# Patient Record
Sex: Female | Born: 1985 | Race: White | Hispanic: No | Marital: Married | Smoking: Never smoker
Health system: Southern US, Community
[De-identification: ages and names within clinical notes are randomized; demographics above are authoritative.]

## PROBLEM LIST (undated history)

## (undated) ENCOUNTER — Inpatient Hospital Stay (HOSPITAL_COMMUNITY): Payer: Self-pay

## (undated) ENCOUNTER — Inpatient Hospital Stay (HOSPITAL_COMMUNITY): Payer: 59

## (undated) DIAGNOSIS — G43909 Migraine, unspecified, not intractable, without status migrainosus: Secondary | ICD-10-CM

## (undated) DIAGNOSIS — Z87448 Personal history of other diseases of urinary system: Secondary | ICD-10-CM

## (undated) DIAGNOSIS — R87629 Unspecified abnormal cytological findings in specimens from vagina: Secondary | ICD-10-CM

## (undated) DIAGNOSIS — Q899 Congenital malformation, unspecified: Secondary | ICD-10-CM

## (undated) HISTORY — PX: KIDNEY SURGERY: SHX687

## (undated) HISTORY — DX: Personal history of other diseases of urinary system: Z87.448

## (undated) HISTORY — DX: Unspecified abnormal cytological findings in specimens from vagina: R87.629

## (undated) HISTORY — DX: Congenital malformation, unspecified: Q89.9

---
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
## 2007-12-08 ENCOUNTER — Ambulatory Visit (HOSPITAL_COMMUNITY): Admission: RE | Admit: 2007-12-08 | Discharge: 2007-12-08 | Payer: Self-pay | Admitting: Urology

## 2008-02-27 IMAGING — CR DG ABD PORTABLE 1V
1 series · 1 of 1 positions shown · non-contrast
Comparison: none

CLINICAL DATA: Post-op. 
 ABDOMEN ? 1 VIEW:

[view not recorded]
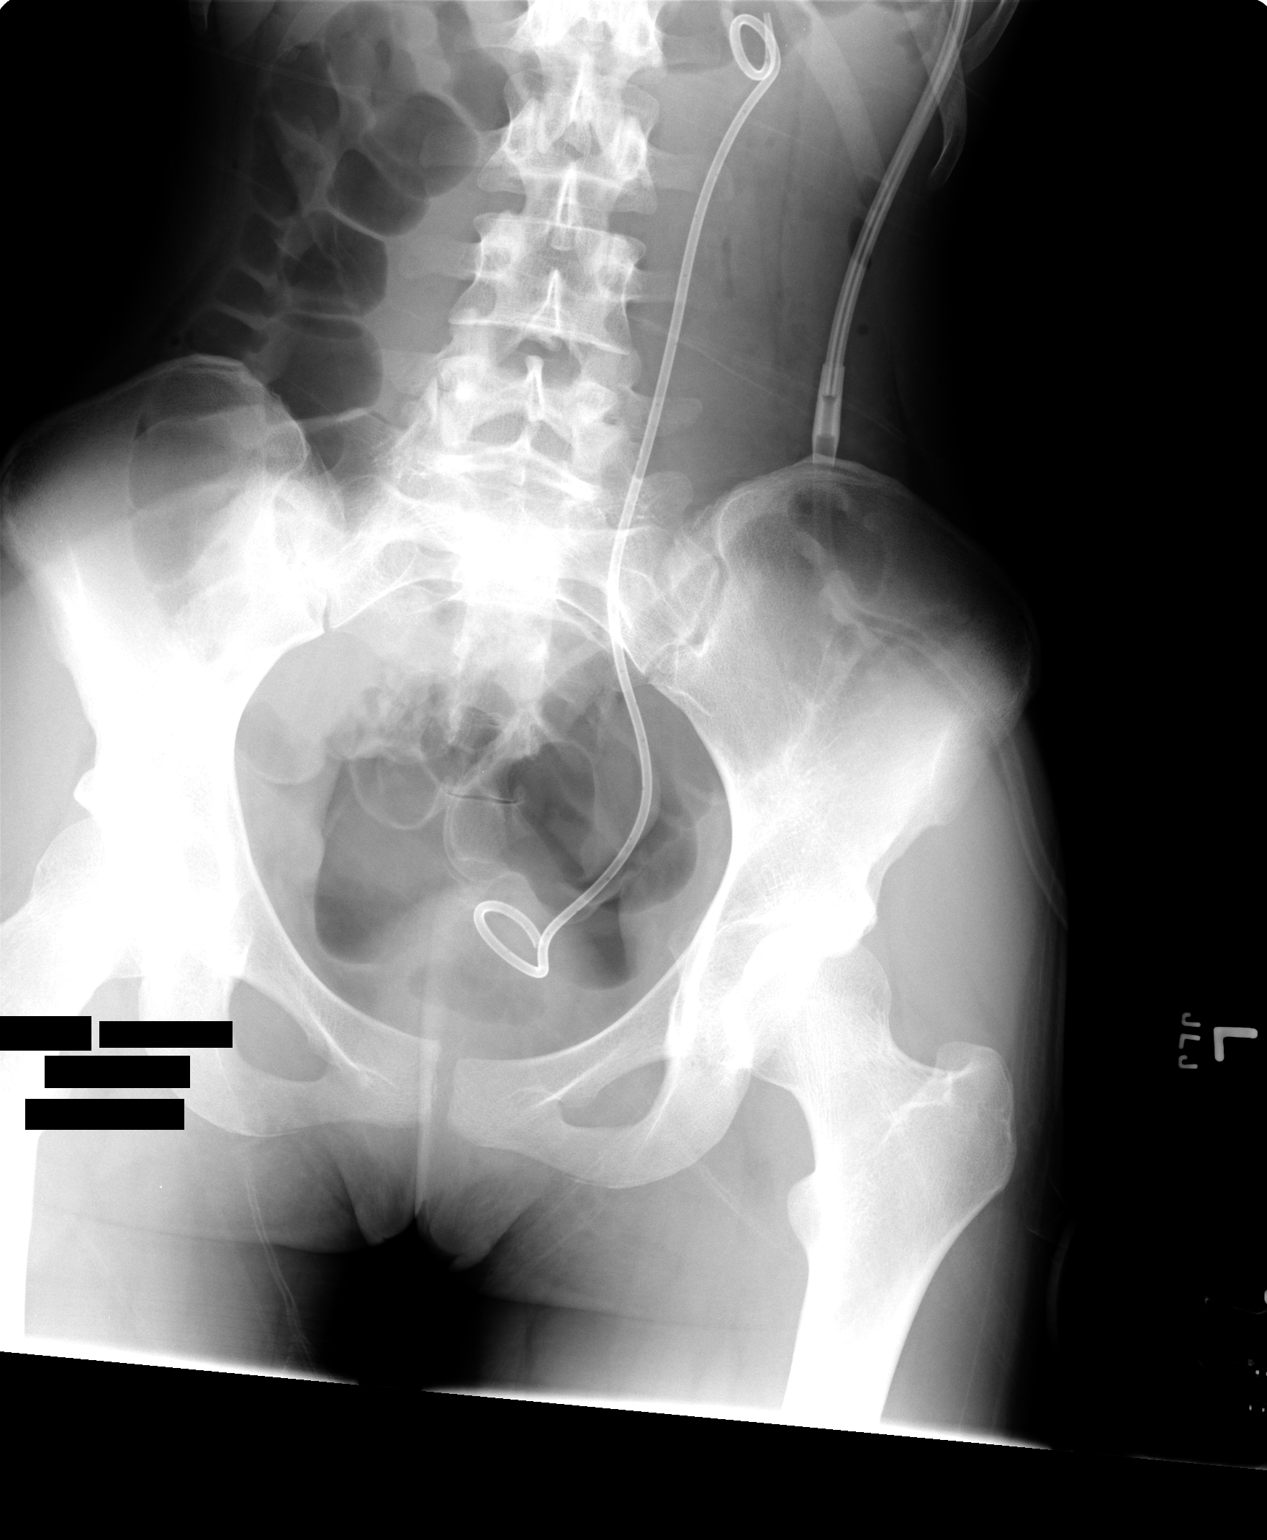

[1 of 1 positions shown; findings below may reference images not displayed]

FINDINGS: Left double J ureteral stent has been placed.  The stent is formed proximally in the region of the renal pelvis and distally in the bladder.  Normal bowel gas pattern.  Osseous structures unremarkable.
IMPRESSION: Left double J stent appears to be in satisfactory position.

## 2008-12-08 ENCOUNTER — Ambulatory Visit (HOSPITAL_COMMUNITY): Admission: RE | Admit: 2008-12-08 | Discharge: 2008-12-08 | Payer: Self-pay | Admitting: Urology

## 2009-01-03 ENCOUNTER — Encounter: Payer: Self-pay | Admitting: Internal Medicine

## 2009-01-17 ENCOUNTER — Encounter (INDEPENDENT_AMBULATORY_CARE_PROVIDER_SITE_OTHER): Payer: Self-pay | Admitting: *Deleted

## 2009-03-02 ENCOUNTER — Ambulatory Visit: Payer: Self-pay | Admitting: Internal Medicine

## 2009-03-02 DIAGNOSIS — R002 Palpitations: Secondary | ICD-10-CM

## 2009-03-05 ENCOUNTER — Ambulatory Visit (HOSPITAL_COMMUNITY): Admission: RE | Admit: 2009-03-05 | Discharge: 2009-03-05 | Payer: Self-pay | Admitting: Cardiology

## 2009-03-05 ENCOUNTER — Ambulatory Visit: Payer: Self-pay | Admitting: Cardiology

## 2009-03-05 ENCOUNTER — Ambulatory Visit: Payer: Self-pay

## 2009-03-05 ENCOUNTER — Encounter: Payer: Self-pay | Admitting: Internal Medicine

## 2009-12-05 ENCOUNTER — Ambulatory Visit (HOSPITAL_COMMUNITY): Admission: RE | Admit: 2009-12-05 | Discharge: 2009-12-05 | Payer: Self-pay | Admitting: Urology

## 2010-05-19 ENCOUNTER — Encounter: Payer: Self-pay | Admitting: Urology

## 2010-05-20 ENCOUNTER — Encounter: Payer: Self-pay | Admitting: Urology

## 2010-09-13 NOTE — Discharge Summary (Signed)
NAMEMORAIMA, BURD NO.:  [id number]   MEDICAL RECORD NO.:  [id number]          PATIENT TYPE:  INP   LOCATION:  1440                         FACILITY:  [facility]   PHYSICIAN:  Heloise Purpura, MD      DATE OF BIRTH:  02-23-1986   DATE OF ADMISSION:  07/16/2006  DATE OF DISCHARGE:  07/19/2006                               DISCHARGE SUMMARY   HISTORY OF PRESENT ILLNESS:  For full details please see admission  history and physical.  Briefly, Nichole is an 25 year old female with a  left ureteropelvic junction obstruction.  She underwent an extensive  evaluation and did appear to have fairly well-preserved renal function  from the left kidney.  She was found to have crossing renal vessels that  appeared to be the source of her obstruction.  After discussing options  with her and her parents, it was decided to proceed with a robotic-  assisted laparoscopic dismembered pyeloplasty.   HOSPITAL COURSE:  The patient underwent cystoscopy with ureteral stent  placement and subsequent robotic-assisted laparoscopic dismembered  pyeloplasty on March 2008.  She tolerated this procedure well and  without complications.  Postoperatively, she was able to be transferred  to a regular hospital room following recovery from anesthesia.  She was  able to begin ambulating on postop day #0.  Her diet was gradually  advanced following return of bowel function.  Once tolerating oral  intake, her pain control was transitioned to oral pain medication.  Her  Foley catheter was removed on postop day #1.  Her perinephric drain was  then carefully monitored and continued to have minimal output.  She did  have some increased output from around the drain, however.  A creatinine  level was checked and was 0.6 consistent with serum.  Her drain was  therefore able to be removed and she was subsequently discharged home on  postop day #2.   DISPOSITION:  Home.   DISCHARGE MEDICATIONS:  Aveen was  instructed to use Vicodin as needed  for pain and told to use Colace as a stool softener.   DISCHARGE INSTRUCTIONS:  The patient was instructed to be ambulatory,  but told to refrain from any heavy lifting, strenuous activity or  driving.   FOLLOW UP:  Marnette is scheduled to follow up in approximately 10-14  days for postoperative evaluation.           ______________________________  Heloise Purpura, MD  Electronically Signed     LB/MEDQ  D:  07/19/2006  T:  07/20/2006  Job:  [id number]

## 2010-09-13 NOTE — H&P (Signed)
NAMEJULEA, Evelyn Hernandez NO.:  [id number]   MEDICAL RECORD NO.:  [id number]          PATIENT TYPE:  INP   LOCATION:  0004                         FACILITY:  [facility]   PHYSICIAN:  Heloise Purpura, MD      DATE OF BIRTH:  June 10, 1985   DATE OF ADMISSION:  07/16/2006  DATE OF DISCHARGE:                              HISTORY & PHYSICAL   CHIEF COMPLAINT:  1. Recurrent pyelonephritis.  2. Left ureteropelvic junction obstruction.   HISTORY:  This is an 25 year old female who has a history of recurrent  left sided pyelonephritis.  She recently underwent an evaluation  including a VCUG and CT scan which demonstrated hydronephrosis and a  possible ureteropelvic junction obstruction.  A Lasix renogram  demonstrated some decreased washout from the left kidney although it did  increase with Lasix of the left kidney, although she did not appear to  be completely obstructed with Lasix.  There was some decreased relative  renal function of the left kidney with 41% compared to 59% on the right  side.  A CT angiogram was also performed which demonstrated lower pole  crossing renal vessels.  Due to the patient's recurrent pain and  pyelonephritis as well as decreased function from left kidney, she and  her parents elected to proceed with repair and a dismembered  pyeloplasty.  We discussed different surgical approaches and she did  wish to proceed in a minimally invasive fashion.   PAST MEDICAL HISTORY:  None.   PAST SURGICAL HISTORY:  None.   MEDICATIONS:  1. Alfalfa.  2. Garlic.  3. Multivitamin.  4. Yasmin.   ALLERGIES:  NO KNOWN DRUG ALLERGIES.   FAMILY HISTORY:  Positive for heart disease and questionable malignancy.  There is no history of renal failure, vesicoureteral reflux or other  urologic problems.   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  She denies  tobacco or alcohol use.   REVIEW OF SYSTEMS:  Complete review of systems was performed.  All  systems were reviewed and were negative.   PHYSICAL EXAM:  CONSTITUTIONAL:  Well-nourished, well-developed age-  appropriate female in no acute distress.  CARDIOVASCULAR:  Regular rate and rhythm without obvious murmurs.  LUNGS:  Clear bilaterally.  ABDOMEN:  Soft, nontender, nondistended without abdominal masses or  bruits.  BACK:  Mild left CVA tenderness.  EXTREMITIES:  No edema.   IMPRESSION:  Left UPJ obstruction.   PLAN:  Evelyn Hernandez will undergo cystoscopy with stent placement and a  robotic assisted laparoscopic dismembered pyeloplasty.  She will then be  admitted to the hospital for routine postoperative care.  .           ______________________________  Heloise Purpura, MD  Electronically Signed     LB/MEDQ  D:  07/16/2006  T:  07/16/2006  Job:  [id number]

## 2010-09-13 NOTE — Op Note (Signed)
Evelyn Hernandez, Evelyn Hernandez NO.:  [id number]   MEDICAL RECORD NO.:  [id number]          PATIENT TYPE:  INP   LOCATION:  0004                         FACILITY:  [facility]   PHYSICIAN:  Heloise Purpura, MD      DATE OF BIRTH:  1986/02/17   DATE OF PROCEDURE:  07/16/2006  DATE OF DISCHARGE:                               OPERATIVE REPORT   PREOPERATIVE DIAGNOSIS:  Left ureteropelvic junction obstruction.   POSTOPERATIVE DIAGNOSIS:  Left ureteropelvic junction obstruction.   OPERATION/PROCEDURE:  1. Cystoscopy.  2. Left retrograde pyelography.  3. A left ureteral stent placement (8 x 26).  4. Left robotic assisted laparoscopic dismembered pyeloplasty.   SURGEON:  Crecencio Mc, M.D.   ASSISTANT:  Bertram Millard. Dahlstedt, M.D.   ANESTHESIA:  General.   COMPLICATIONS:  None.   ESTIMATED BLOOD LOSS:  30 mL.   IV FLUIDS:  2200 mL of lactated Ringer's.   DRAINS:  1. 16-French Foley catheter.  2. #15 Blake perinephric drain.   INDICATIONS:  Evelyn Hernandez is an 25 year old female with a history of  recurrent left-sided pyelonephritis and flank pain.  She was  subsequently found to have left-sided hydronephrosis and after  undergoing further evaluation was found to have a left ureteropelvic  junction obstruction.  She was noted to have some decreased relative  renal function of her left kidney, although still preservable function.  She also underwent a CT angiogram which demonstrated crossing lower pole  renal vessels.  After discussing these findings and options for  treatment, the patient and her parents elected to proceed with a  dismembered pyeloplasty and the above procedure.  Potential risks and  benefits were discussed with the patient and her family and informed  consent was obtained.   DESCRIPTION OF PROCEDURE:  The patient was taken to the operating room  and a general anesthetic was administered.  She was given preoperative  antibiotics, placed in the dorsal lithotomy  position, and prepped and  draped in the usual sterile fashion.  Next cystourethroscopy was  performed.  This demonstrated a normal bladder without mucosal  pathology.  The ureteral orifices were in the normal anatomic position  and effluxing clear urine.  Attention turned to the left ureteral  orifice and a 6-French ureteral catheter was used to intubate the  orifice.  This was injected with contrast which demonstrated a normal  caliber ureter and a dilated renal pelvis consistent with the patient's  UPJ obstruction.  No filling defects were noted in the ureter or the  renal pelvis.  A 0.038 sensor guidewire was then inserted up into the  renal pelvis under fluoroscopic guidance without difficulty.  An 8 x 26  double-J ureteral stent was then advanced over the wire and  appropriately positioned under fluoroscopic and cystoscopic guidance.  The wire was removed with a good curl noted in the renal pelvis as well  as in the bladder.  The cystoscope was then removed and a 16-French  Foley catheter was placed.  The patient was then repositioned in the  left modified flank position with care to pad all potential pressure  points.  The patient's abdomen was then reprepped and draped in the  usual sterile fashion.  A site was selected just superior to the  umbilicus for placement of the camera port.  This was placed using a  standard open Hassan technique allowing entry into the peritoneal cavity  under direct vision.  A 12 mm port was then placed and a  pneumoperitoneum was established.  Attention then turned to placement of  the remaining ports.  An 8 mm robotic port was placed in the left upper  quadrant.  An additional 8 mm port was placed in the right lower  quadrant with a robotic port for the fourth arm also placed more distal  and laterally in the right lower quadrant.  An additional 12 mm port for  laparoscopic assistance was placed in the midline below the umbilicus.  All ports were  placed under direct vision without difficulty.  The  surgical cart was then docked.  With the aid of the cautery scissors,  the white line of Toldt was incised along the length of the descending  colon allowing the colon to be mobilized medially and the space between  the anterior layer of Gerota's fascia and the colonic mesentery to be  developed.  This exposed the retroperitoneum and the ureter was easily  identified with the indwelling stent in place.  The ureter was able to  be lifted off the psoas muscle and separated from the surrounding  retroperitoneal fat structures up to the level of the kidney where a  lower pole crossing renal artery and vein were identified.  The renal  pelvis and ureter were then mobilized away from the crossing vessels and  underlying psoas muscle.  Once adequately mobilized, the ureteropelvic  junction was identified.  Care was taken not to devascularize the ureter  or renal pelvis.  The ureter was then divided at the level of the  ureteropelvic junction.  The stent was identified and removed from the  renal pelvis.  The renal pelvis was then spatulated widely medially.  The ureter was spatulated widely laterally.  The renal pelvis and ureter  were then brought anterior to the crossing vessels.  4-0 Vicryl  interrupted sutures were then placed to reapproximate the medial and  lateral aspect of the ureter and renal pelvis.  Using these sutures as  stay sutures,  the posterior anastomosis was then performed in a running  fashion with a 4-0 Vicryl suture.  The anastomosis appeared to be  tension-free.   Attention then turned to the anterior aspect and the ureteral stent was  then placed back into the renal pelvis.  The anterior of anastomosis was  then performed in a tension-free manner with a running 4-0 Vicryl  suture.  The overlying retroperitoneal fascia was then reapproximated over the repair.  A #15 round Blake drain was then passed through the  most  inferior and lateral robotic port and appropriately positioned  around the anastomosis.  It was secured to the skin with a nylon suture.  The surgical cart was then undocked.  All ports were then removed under  direct vision.  The 12 mm port sites were then closed with figure-of-  eight 0 Vicryl  fascial sutures.  All port sites were injected with  0.25% Marcaine and reapproximated to the skin level with 4-0 Monocryl  subcuticular closures.  Steri-Strips and sterile dressings were applied.  The patient appeared to tolerate the procedure well without  complications.  She was able to be extubated  and transferred to recovery  unit in satisfactory condition.           ______________________________  Heloise Purpura, MD  Electronically Signed     LB/MEDQ  D:  07/16/2006  T:  07/16/2006  Job:  [id number]

## 2010-11-28 ENCOUNTER — Emergency Department (HOSPITAL_COMMUNITY)
Admission: EM | Admit: 2010-11-28 | Discharge: 2010-11-28 | Disposition: A | Discharge status: Home / Self Care | Payer: Managed Care, Other (non HMO) | Attending: Emergency Medicine | Admitting: Emergency Medicine

## 2010-11-28 DIAGNOSIS — G43909 Migraine, unspecified, not intractable, without status migrainosus: Secondary | ICD-10-CM | POA: Insufficient documentation

## 2010-11-28 DIAGNOSIS — R42 Dizziness and giddiness: Secondary | ICD-10-CM | POA: Insufficient documentation

## 2010-11-28 DIAGNOSIS — R112 Nausea with vomiting, unspecified: Secondary | ICD-10-CM | POA: Insufficient documentation

## 2010-11-28 LAB — POCT I-STAT, CHEM 8
Chloride: 108 mEq/L (ref 96–112)
Glucose, Bld: 101 mg/dL — ABNORMAL HIGH (ref 70–99)
HCT: 39 % (ref 36.0–46.0)
Potassium: 3.4 mEq/L — ABNORMAL LOW (ref 3.5–5.1)

## 2010-11-28 LAB — POCT PREGNANCY, URINE: Preg Test, Ur: NEGATIVE

## 2010-11-28 LAB — URINE MICROSCOPIC-ADD ON

## 2010-11-28 LAB — URINALYSIS, ROUTINE W REFLEX MICROSCOPIC
Hgb urine dipstick: NEGATIVE
Nitrite: NEGATIVE
Specific Gravity, Urine: 1.021 (ref 1.005–1.030)
Urobilinogen, UA: 0.2 mg/dL (ref 0.0–1.0)

## 2010-11-29 LAB — URINE CULTURE: Culture: NO GROWTH

## 2011-07-24 ENCOUNTER — Other Ambulatory Visit (HOSPITAL_COMMUNITY): Payer: Self-pay | Admitting: Urology

## 2011-07-30 ENCOUNTER — Encounter (HOSPITAL_COMMUNITY)
Admission: RE | Admit: 2011-07-30 | Discharge: 2011-07-30 | Disposition: A | Discharge status: Home / Self Care | Payer: Managed Care, Other (non HMO) | Source: Ambulatory Visit | Attending: Urology | Admitting: Urology

## 2011-07-30 ENCOUNTER — Encounter (HOSPITAL_COMMUNITY): Payer: Self-pay

## 2011-07-30 DIAGNOSIS — Q6239 Other obstructive defects of renal pelvis and ureter: Secondary | ICD-10-CM | POA: Insufficient documentation

## 2011-07-30 MED ORDER — TECHNETIUM TC 99M MERTIATIDE
15.0000 | Freq: Once | INTRAVENOUS | Status: AC | PRN
  Administered 2011-07-30: 15 via INTRAVENOUS

## 2011-07-30 MED ORDER — FUROSEMIDE 10 MG/ML IJ SOLN
40.0000 mg | Freq: Once | INTRAMUSCULAR | Status: DC
  Filled 2011-07-30: qty 4

## 2012-12-14 ENCOUNTER — Encounter (HOSPITAL_COMMUNITY): Payer: Self-pay | Admitting: *Deleted

## 2012-12-14 ENCOUNTER — Emergency Department (HOSPITAL_COMMUNITY)
Admission: EM | Admit: 2012-12-14 | Discharge: 2012-12-14 | Disposition: A | Discharge status: Home / Self Care | Payer: 59 | Attending: Emergency Medicine | Admitting: Emergency Medicine

## 2012-12-14 DIAGNOSIS — R1110 Vomiting, unspecified: Secondary | ICD-10-CM | POA: Insufficient documentation

## 2012-12-14 DIAGNOSIS — Z8679 Personal history of other diseases of the circulatory system: Secondary | ICD-10-CM | POA: Insufficient documentation

## 2012-12-14 DIAGNOSIS — H9209 Otalgia, unspecified ear: Secondary | ICD-10-CM | POA: Insufficient documentation

## 2012-12-14 DIAGNOSIS — Z3202 Encounter for pregnancy test, result negative: Secondary | ICD-10-CM | POA: Insufficient documentation

## 2012-12-14 DIAGNOSIS — R42 Dizziness and giddiness: Secondary | ICD-10-CM

## 2012-12-14 DIAGNOSIS — R6883 Chills (without fever): Secondary | ICD-10-CM | POA: Insufficient documentation

## 2012-12-14 HISTORY — DX: Migraine, unspecified, not intractable, without status migrainosus: G43.909

## 2012-12-14 LAB — URINALYSIS, ROUTINE W REFLEX MICROSCOPIC
Bilirubin Urine: NEGATIVE
Hgb urine dipstick: NEGATIVE
Ketones, ur: NEGATIVE mg/dL
Nitrite: NEGATIVE
Protein, ur: NEGATIVE mg/dL
Specific Gravity, Urine: 1.01 (ref 1.005–1.030)
Urobilinogen, UA: 0.2 mg/dL (ref 0.0–1.0)

## 2012-12-14 MED ORDER — PROMETHAZINE HCL 25 MG/ML IJ SOLN
12.5000 mg | Freq: Once | INTRAMUSCULAR | Status: AC
  Administered 2012-12-14: 12.5 mg via INTRAVENOUS
  Filled 2012-12-14: qty 1

## 2012-12-14 MED ORDER — ONDANSETRON 4 MG PO TBDP
ORAL_TABLET | ORAL | Status: AC
  Filled 2012-12-14: qty 1

## 2012-12-14 MED ORDER — ONDANSETRON 4 MG PO TBDP
4.0000 mg | ORAL_TABLET | Freq: Once | ORAL | Status: AC
  Administered 2012-12-14: 4 mg via ORAL

## 2012-12-14 MED ORDER — SODIUM CHLORIDE 0.9 % IV BOLUS (SEPSIS)
1000.0000 mL | Freq: Once | INTRAVENOUS | Status: AC
  Administered 2012-12-14: 1000 mL via INTRAVENOUS

## 2012-12-14 NOTE — ED Provider Notes (Signed)
CSN: [id number]     Arrival date & time 12/14/12  1658 History     First MD Initiated Contact with Patient 12/14/12 1740     Chief Complaint  Patient presents with  . Numbness   (Consider location/radiation/quality/duration/timing/severity/associated sxs/prior Treatment) The history is provided by the patient and a relative.   patient with right ear pain and "dizziness". She both feels as if she is spinning around as if she would pass out. She's had previous episodes like this. The symptoms began with ear pain but the dizziness began today. Her confusion. She has been vomiting. No Abdominal pain. She states she feels cold. No confusion. She's had a similar episode in the past that was reportedly a migraine. Patient states it is possible she is pregnant. Past Medical History  Diagnosis Date  . Migraine    Past Surgical History  Procedure Laterality Date  . Kidney surgery     History reviewed. No pertinent family history. History  Substance Use Topics  . Smoking status: Never Smoker   . Smokeless tobacco: Not on file  . Alcohol Use: Yes   OB History   Grav Para Term Preterm Abortions TAB SAB Ect Mult Living                 Review of Systems  Constitutional: Positive for chills. Negative for activity change and appetite change.  HENT: Negative for neck stiffness.   Eyes: Negative for photophobia and pain.  Respiratory: Negative for chest tightness and shortness of breath.   Cardiovascular: Negative for chest pain and leg swelling.  Gastrointestinal: Negative for nausea, vomiting, abdominal pain and diarrhea.  Genitourinary: Negative for flank pain.  Musculoskeletal: Negative for back pain.  Skin: Negative for rash.  Neurological: Positive for dizziness and light-headedness. Negative for weakness, numbness and headaches.  Psychiatric/Behavioral: Negative for behavioral problems.    Allergies  Review of patient's allergies indicates no known allergies.  Home Medications    Current Outpatient Rx  Name  Route  Sig  Dispense  Refill  . lansoprazole (PREVACID) 30 MG capsule   Oral   Take 30 mg by mouth every morning.         . Prenatal Vit-Fe Fumarate-FA (PRENATAL MULTIVITAMIN) TABS tablet   Oral   Take 1 tablet by mouth daily at 12 noon.          BP 97/56  Pulse 78  Temp(Src) 97.9 F (36.6 C) (Oral)  Resp 24  Ht 5\' 4"  (1.626 m)  Wt 110 lb (49.896 kg)  BMI 18.87 kg/m2  SpO2 100%  LMP 11/18/2012 Physical Exam  Nursing note and vitals reviewed. Constitutional: She is oriented to person, place, and time. She appears well-developed and well-nourished.  Patient appears uncomfortable  HENT:  Head: Normocephalic and atraumatic.  Eyes: EOM are normal. Pupils are equal, round, and reactive to light.  Patient with mild nystagmus.  Neck: Normal range of motion. Neck supple.  Cardiovascular: Normal rate, regular rhythm and normal heart sounds.   No murmur heard. Pulmonary/Chest: Effort normal and breath sounds normal. No respiratory distress. She has no wheezes. She has no rales.  Abdominal: Soft. Bowel sounds are normal. She exhibits no distension. There is no tenderness. There is no rebound and no guarding.  Musculoskeletal: Normal range of motion.  Neurological: She is alert and oriented to person, place, and time. No cranial nerve deficit.  Skin: Skin is warm and dry.  Psychiatric: She has a normal mood and affect. Her speech is normal.  ED Course   Procedures (including critical care time)  Labs Reviewed  URINALYSIS, ROUTINE W REFLEX MICROSCOPIC  POCT PREGNANCY, URINE   No results found. 1. Dizziness     MDM  Patient with dizziness that is making her vomit. Had mild nystagmus. Had previous episode that was thought to be migraine. Patient feels better after treatment. She is tolerated orals. She has normalized her neurologic exam and will be discharged home  Juliet Rude. Rubin Payor, MD 12/18/12 [phone]

## 2012-12-14 NOTE — ED Notes (Signed)
Pt resting w/ eyes closed, family states no increase in nausea after sipping on some ginger ale. NAD noted, no needs voiced by family.

## 2012-12-14 NOTE — ED Notes (Signed)
Pt alert & oriented x4, stable gait. Patient  given discharge instructions, paperwork & prescription(s). Patient verbalized understanding. Pt left department w/ no further questions. 

## 2012-12-14 NOTE — ED Notes (Signed)
Vomiting x 1

## 2012-12-14 NOTE — ED Notes (Signed)
The patient states that she feels very dizzy, very anxious, hyperventilating at present, complains that she feels like she is going to pass out.  Attempted to reassure patient, coached to focus on breathing.  Family at bedside and very supportive.

## 2012-12-14 NOTE — ED Notes (Signed)
Dizzy, Has had ear pain, Hx of migraines.  Vomiting at triage. Says sinus congestion . Pale at triage and hyperventilation with numbness of hands and feet.

## 2012-12-15 ENCOUNTER — Encounter (HOSPITAL_COMMUNITY): Payer: Self-pay

## 2013-02-10 LAB — OB RESULTS CONSOLE HEPATITIS B SURFACE ANTIGEN: Hepatitis B Surface Ag: NEGATIVE

## 2013-02-10 LAB — OB RESULTS CONSOLE GBS: GBS: POSITIVE

## 2013-02-10 LAB — OB RESULTS CONSOLE ANTIBODY SCREEN: ANTIBODY SCREEN: NEGATIVE

## 2013-02-10 LAB — OB RESULTS CONSOLE GC/CHLAMYDIA
CHLAMYDIA, DNA PROBE: NEGATIVE
Gonorrhea: NEGATIVE

## 2013-02-10 LAB — OB RESULTS CONSOLE RUBELLA ANTIBODY, IGM: RUBELLA: IMMUNE

## 2013-02-10 LAB — OB RESULTS CONSOLE RPR: RPR: NONREACTIVE

## 2013-02-10 LAB — OB RESULTS CONSOLE HIV ANTIBODY (ROUTINE TESTING): HIV: NONREACTIVE

## 2013-02-10 LAB — OB RESULTS CONSOLE ABO/RH: RH TYPE: POSITIVE

## 2013-03-12 IMAGING — NM NM RENAL IMAGING FLOW W/ PHARM
2 series · 12 of 12 positions shown · non-contrast
Comparison: 12/05/2009

CLINICAL DATA: Congenital UPJ obstruction

NUCLEAR MEDICINE RENAL SCINTIANGIOGRAPHY WITH FLOW AND FUNCTION AND
PHARMACOLOGIC AUGMENTATION
TECHNIQUE: Radionuclide angiographic and sequential renal images
were obtained after intravenous injection of radiopharmaceutical.
Imaging was continued during slow intravenous injection of Lasix
approximately 20-30 minutes after the start of the examin
Radiopharmaceutical: FPTYBBY KRISDNATA NORMURATI BINTI BESAR3 TECHNETIUM TC 99M
MERTIATIDE

[Series 1: re renal qualitative · 9.51mm/px · 6 of 130 frames shown (1 of 2)]
[frame 11/130]
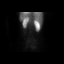
[frame 33/130]
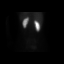
[frame 55/130]
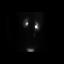
[frame 76/130]
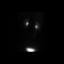
[frame 98/130]
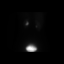
[frame 120/130]
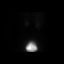

[Series 1: re renal qualitative · 9.51mm/px · 6 of 130 frames shown (2 of 2)]
[frame 11/130]
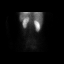
[frame 33/130]
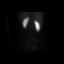
[frame 55/130]
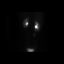
[frame 76/130]
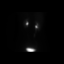
[frame 98/130]
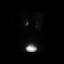
[frame 120/130]
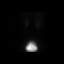

[12 of 12 positions shown; findings below may reference images not displayed]

FINDINGS: On the flow phase portion of the examination there is
slight asymmetric diminished perfusion to the left kidney.  On the
parenchymal phase of the examination there is normal cortical
uptake to the right kidney.  Slight asymmetric diminished cortical
uptake to the left kidney is noted.  The split renal function is
equal to 42.6% from the left kidney and 57.4% to the right kidney.
This is not significantly changed from the previous examination.

On the delayed phase portion of the examination there is symmetric
excretion of the radiopharmaceutical by both kidneys.

Following the intravenous administration of Lasix there is normal
clearance of the radiopharmaceutical from the right renal
collecting system. Mild, relatively delayed clearance of the
radiopharmaceutical from the left renal collecting system is noted.
IMPRESSION: 1.  There is a mild asymmetric diminished perfusion and cortical
uptake of the radiopharmaceutical by the left kidney.  There is
also very mild delayed clearance of the radiopharmaceutical from
the left renal collecting system.  Compared with previous
examination this is not significantly changed.
2.  Stable split renal function.  The differential is equal to
42.6% from the left kidney and 57.4% in the right kidney.

## 2013-04-28 NOTE — L&D Delivery Note (Signed)
SVD of VMI at 1525 on 09/28/13.  APGARs 8,9.  EBL 250cc.  Placenta to L&D. Particulate meconium. Head delivered LOA with loose nuchal x 1 reduced.  Body delivered atraumatically.  Mouth and nose bulb suctioned before placement on abdomen.  Cord was clamped and cut.  Placenta delivered S/I/3VC.  Fundus was firmed with pitocin and massage.  2nd degree perineal, left labial and right periurethral lacs repaired with 3-0 Vicryl in the normal fashion.  Mom and baby stable.  Mitchel Honour, DO

## 2013-07-02 ENCOUNTER — Inpatient Hospital Stay (HOSPITAL_COMMUNITY)
Admission: AD | Admit: 2013-07-02 | Discharge: 2013-07-02 | Disposition: A | Discharge status: Home / Self Care | Payer: 59 | Source: Ambulatory Visit | Attending: Obstetrics and Gynecology | Admitting: Obstetrics and Gynecology

## 2013-07-02 ENCOUNTER — Encounter (HOSPITAL_COMMUNITY): Payer: Self-pay | Admitting: *Deleted

## 2013-07-02 DIAGNOSIS — O9989 Other specified diseases and conditions complicating pregnancy, childbirth and the puerperium: Principal | ICD-10-CM

## 2013-07-02 DIAGNOSIS — O99891 Other specified diseases and conditions complicating pregnancy: Secondary | ICD-10-CM | POA: Insufficient documentation

## 2013-07-02 DIAGNOSIS — R109 Unspecified abdominal pain: Secondary | ICD-10-CM | POA: Insufficient documentation

## 2013-07-02 DIAGNOSIS — O212 Late vomiting of pregnancy: Secondary | ICD-10-CM | POA: Insufficient documentation

## 2013-07-02 DIAGNOSIS — K5289 Other specified noninfective gastroenteritis and colitis: Secondary | ICD-10-CM | POA: Insufficient documentation

## 2013-07-02 DIAGNOSIS — K529 Noninfective gastroenteritis and colitis, unspecified: Secondary | ICD-10-CM

## 2013-07-02 DIAGNOSIS — R197 Diarrhea, unspecified: Secondary | ICD-10-CM | POA: Insufficient documentation

## 2013-07-02 NOTE — Discharge Instructions (Signed)
Viral Gastroenteritis Viral gastroenteritis is also known as stomach flu. This condition affects the stomach and intestinal tract. It can cause sudden diarrhea and vomiting. The illness typically lasts 3 to 8 days. Most people develop an immune response that eventually gets rid of the virus. While this natural response develops, the virus can make you quite ill. CAUSES  Many different viruses can cause gastroenteritis, such as rotavirus or noroviruses. You can catch one of these viruses by consuming contaminated food or water. You may also catch a virus by sharing utensils or other personal items with an infected person or by touching a contaminated surface. SYMPTOMS  The most common symptoms are diarrhea and vomiting. These problems can cause a severe loss of body fluids (dehydration) and a body salt (electrolyte) imbalance. Other symptoms may include:  Fever.  Headache.  Fatigue.  Abdominal pain. DIAGNOSIS  Your caregiver can usually diagnose viral gastroenteritis based on your symptoms and a physical exam. A stool sample may also be taken to test for the presence of viruses or other infections. TREATMENT  This illness typically goes away on its own. Treatments are aimed at rehydration. The most serious cases of viral gastroenteritis involve vomiting so severely that you are not able to keep fluids down. In these cases, fluids must be given through an intravenous line (IV). HOME CARE INSTRUCTIONS   Drink enough fluids to keep your urine clear or pale yellow. Drink small amounts of fluids frequently and increase the amounts as tolerated.  Ask your caregiver for specific rehydration instructions.  Avoid:  Foods high in sugar.  Alcohol.  Carbonated drinks.  Tobacco.  Juice.  Caffeine drinks.  Extremely hot or cold fluids.  Fatty, greasy foods.  Too much intake of anything at one time.  Dairy products until 24 to 48 hours after diarrhea stops.  You may consume probiotics.  Probiotics are active cultures of beneficial bacteria. They may lessen the amount and number of diarrheal stools in adults. Probiotics can be found in yogurt with active cultures and in supplements.  Wash your hands well to avoid spreading the virus.  Only take over-the-counter or prescription medicines for pain, discomfort, or fever as directed by your caregiver. Do not give aspirin to children. Antidiarrheal medicines are not recommended.  Ask your caregiver if you should continue to take your regular prescribed and over-the-counter medicines.  Keep all follow-up appointments as directed by your caregiver. SEEK IMMEDIATE MEDICAL CARE IF:   You are unable to keep fluids down.  You do not urinate at least once every 6 to 8 hours.  You develop shortness of breath.  You notice blood in your stool or vomit. This may look like coffee grounds.  You have abdominal pain that increases or is concentrated in one small area (localized).  You have persistent vomiting or diarrhea.  You have a fever.  The patient is a child younger than 3 months, and he or she has a fever.  The patient is a child older than 3 months, and he or she has a fever and persistent symptoms.  The patient is a child older than 3 months, and he or she has a fever and symptoms suddenly get worse.  The patient is a baby, and he or she has no tears when crying. MAKE SURE YOU:   Understand these instructions.  Will watch your condition.  Will get help right away if you are not doing well or get worse. Document Released: 04/14/2005 Document Revised: 07/07/2011 Document Reviewed: 01/29/2011   [employer] Patient Information 2014 [employer]  

## 2013-07-02 NOTE — MAU Provider Note (Signed)
History     CSN: [id number]  Arrival date and time: 07/02/13 [id number]   None     Chief Complaint  Patient presents with  . Diarrhea  . Abdominal Pain   Diarrhea  Associated symptoms include abdominal pain. Pertinent negatives include no chills, fever, myalgias or vomiting.  Abdominal Pain Associated symptoms include diarrhea. Pertinent negatives include no constipation, fever, myalgias, nausea or vomiting.   This is a 28 y.o. female at 5530w2d who presents with c/o diarrhea with lower abdominal cramping.  Started having nausea, vomiting and fever on Wednesday.  N/V went away yesterday but cramping got worse yesterday, improved today. Is worried that cramping may be contractions, and wants to check to see if baby is ok. Is hungry. No vomiting today  Diarrhea is worse at night. Felt pretty good during the day. Using Immodium.  RN Note:    Pt reports nausea, vomiting on Wednesday: fever on Wednesday. Diarrhea was also on that day but the diarrhea has continued. Lower abd pain.        OB History   Grav Para Term Preterm Abortions TAB SAB Ect Mult Living   1               Past Medical History  Diagnosis Date  . Migraine     Past Surgical History  Procedure Laterality Date  . Kidney surgery      History reviewed. No pertinent family history.  History  Substance Use Topics  . Smoking status: Never Smoker   . Smokeless tobacco: Not on file  . Alcohol Use: Yes    Allergies: No Known Allergies  Prescriptions prior to admission  Medication Sig Dispense Refill  . loperamide (IMODIUM A-D) 2 MG tablet Take 2 mg by mouth 4 (four) times daily as needed for diarrhea or loose stools.      . Prenatal Vit-Fe Fumarate-FA (PRENATAL MULTIVITAMIN) TABS tablet Take 1 tablet by mouth daily at 12 noon.      . ranitidine (ZANTAC) 75 MG tablet Take 75 mg by mouth 2 (two) times daily.      . lansoprazole (PREVACID) 30 MG capsule Take 30 mg by mouth every morning.        Review of Systems   Constitutional: Positive for malaise/fatigue. Negative for fever and chills.  Gastrointestinal: Positive for abdominal pain and diarrhea. Negative for nausea, vomiting and constipation.  Musculoskeletal: Negative for myalgias.  Neurological: Negative for dizziness.   Physical Exam   Blood pressure 119/77, pulse 103, temperature 98.4 F (36.9 C), temperature source Oral, resp. rate 16, height 5\' 4"  (1.626 m), weight 64.048 kg (141 lb 3.2 oz), last menstrual period 11/18/2012, SpO2 100.00%.  Physical Exam  Constitutional: She is oriented to person, place, and time. She appears well-developed. No distress.  HENT:  Head: Normocephalic.  Cardiovascular: Normal rate.   Respiratory: Effort normal.  GI: Soft. She exhibits no distension. There is no tenderness (not tender to palpation, pain is with cramps). There is no rebound and no guarding.  Genitourinary: Vagina normal and uterus normal. No vaginal discharge found.  Dilation: Closed Effacement (%): Thick Exam by:: Williams,CNM   Musculoskeletal: Normal range of motion.  Neurological: She is alert and oriented to person, place, and time.  Skin: Skin is warm and dry.  Psychiatric: She has a normal mood and affect.    MAU Course  Procedures  MDM No results found for this or any previous visit (from the past 24 hour(s)).   Assessment and Plan  A:  SIUP at [redacted]w[redacted]d       Gastroenteritis  P:  Discussed with Dr Rana Snare       Discharge home       May continue Immodium       Reviewed what to come back for, PTL, leaking bleeding or decreased FM or development of fever  [facility] 07/02/2013, 3:25 AM

## 2013-07-02 NOTE — MAU Note (Signed)
Pt reports nausea, vomiting on Wednesday: fever on Wednesday. Diarrhea was also on that day but the diarrhea has continued. Lower abd pain.

## 2013-09-16 ENCOUNTER — Encounter (HOSPITAL_COMMUNITY): Admission: RE | Payer: Self-pay | Source: Ambulatory Visit

## 2013-09-16 ENCOUNTER — Inpatient Hospital Stay (HOSPITAL_COMMUNITY): Admission: RE | Admit: 2013-09-16 | Payer: 59 | Source: Ambulatory Visit | Admitting: Obstetrics & Gynecology

## 2013-09-16 SURGERY — Surgical Case
Anesthesia: Regional

## 2013-09-21 ENCOUNTER — Encounter (HOSPITAL_COMMUNITY): Payer: Self-pay | Admitting: *Deleted

## 2013-09-21 ENCOUNTER — Telehealth (HOSPITAL_COMMUNITY): Payer: Self-pay | Admitting: *Deleted

## 2013-09-21 LAB — OB RESULTS CONSOLE GBS: GBS: POSITIVE

## 2013-09-21 NOTE — Telephone Encounter (Signed)
Preadmission screen  

## 2013-09-27 ENCOUNTER — Encounter (HOSPITAL_COMMUNITY): Payer: Self-pay

## 2013-09-27 ENCOUNTER — Inpatient Hospital Stay (HOSPITAL_COMMUNITY)
Admission: RE | Admit: 2013-09-27 | Discharge: 2013-09-30 | DRG: 775 | Disposition: A | Discharge status: Home / Self Care | Payer: 59 | Source: Ambulatory Visit | Attending: Obstetrics & Gynecology | Admitting: Obstetrics & Gynecology

## 2013-09-27 DIAGNOSIS — Z22330 Carrier of Group B streptococcus: Secondary | ICD-10-CM | POA: Diagnosis not present

## 2013-09-27 DIAGNOSIS — O99892 Other specified diseases and conditions complicating childbirth: Secondary | ICD-10-CM | POA: Diagnosis present

## 2013-09-27 DIAGNOSIS — O480 Post-term pregnancy: Secondary | ICD-10-CM | POA: Diagnosis present

## 2013-09-27 DIAGNOSIS — Z3490 Encounter for supervision of normal pregnancy, unspecified, unspecified trimester: Secondary | ICD-10-CM

## 2013-09-27 DIAGNOSIS — O9989 Other specified diseases and conditions complicating pregnancy, childbirth and the puerperium: Secondary | ICD-10-CM

## 2013-09-27 LAB — CBC
HCT: 33.7 % — ABNORMAL LOW (ref 36.0–46.0)
Hemoglobin: 11.1 g/dL — ABNORMAL LOW (ref 12.0–15.0)
MCH: 29.9 pg (ref 26.0–34.0)
MCHC: 32.9 g/dL (ref 30.0–36.0)
MCV: 90.8 fL (ref 78.0–100.0)
Platelets: 250 10*3/uL (ref 150–400)
RBC: 3.71 MIL/uL — ABNORMAL LOW (ref 3.87–5.11)
RDW: 15 % (ref 11.5–15.5)
WBC: 12.6 10*3/uL — AB (ref 4.0–10.5)

## 2013-09-27 MED ORDER — LIDOCAINE HCL (PF) 1 % IJ SOLN
30.0000 mL | INTRAMUSCULAR | Status: DC | PRN
  Filled 2013-09-27: qty 30

## 2013-09-27 MED ORDER — OXYTOCIN 40 UNITS IN LACTATED RINGERS INFUSION - SIMPLE MED: 1.0000 m[IU]/min | INTRAVENOUS | Status: DC

## 2013-09-27 MED ORDER — IBUPROFEN 600 MG PO TABS: 600.0000 mg | ORAL_TABLET | Freq: Four times a day (QID) | ORAL | Status: DC | PRN

## 2013-09-27 MED ORDER — CITRIC ACID-SODIUM CITRATE 334-500 MG/5ML PO SOLN: 30.0000 mL | ORAL | Status: DC | PRN

## 2013-09-27 MED ORDER — ONDANSETRON HCL 4 MG/2ML IJ SOLN
4.0000 mg | Freq: Four times a day (QID) | INTRAMUSCULAR | Status: DC | PRN
Start: 2013-09-27 — End: 2013-09-28
  Administered 2013-09-28: 4 mg via INTRAVENOUS
  Filled 2013-09-27: qty 2

## 2013-09-27 MED ORDER — OXYCODONE-ACETAMINOPHEN 5-325 MG PO TABS: 1.0000 | ORAL_TABLET | ORAL | Status: DC | PRN

## 2013-09-27 MED ORDER — PENICILLIN G POTASSIUM 5000000 UNITS IJ SOLR
2.5000 10*6.[IU] | INTRAVENOUS | Status: DC
  Filled 2013-09-27 (×2): qty 2.5

## 2013-09-27 MED ORDER — OXYTOCIN BOLUS FROM INFUSION: 500.0000 mL | INTRAVENOUS | Status: DC

## 2013-09-27 MED ORDER — ZOLPIDEM TARTRATE 5 MG PO TABS: 5.0000 mg | ORAL_TABLET | Freq: Every evening | ORAL | Status: DC | PRN

## 2013-09-27 MED ORDER — MISOPROSTOL 25 MCG QUARTER TABLET
25.0000 ug | ORAL_TABLET | ORAL | Status: DC | PRN
  Administered 2013-09-27: 25 ug via VAGINAL
  Filled 2013-09-27: qty 0.25

## 2013-09-27 MED ORDER — LACTATED RINGERS IV SOLN
500.0000 mL | INTRAVENOUS | Status: DC | PRN
  Administered 2013-09-28: 300 mL via INTRAVENOUS

## 2013-09-27 MED ORDER — PENICILLIN G POTASSIUM 5000000 UNITS IJ SOLR
5.0000 10*6.[IU] | Freq: Once | INTRAVENOUS | Status: DC
  Filled 2013-09-27: qty 5

## 2013-09-27 MED ORDER — OXYTOCIN 40 UNITS IN LACTATED RINGERS INFUSION - SIMPLE MED: 62.5000 mL/h | INTRAVENOUS | Status: DC

## 2013-09-27 MED ORDER — FLEET ENEMA 7-19 GM/118ML RE ENEM: 1.0000 | ENEMA | RECTAL | Status: DC | PRN

## 2013-09-27 MED ORDER — TERBUTALINE SULFATE 1 MG/ML IJ SOLN: 0.2500 mg | Freq: Once | INTRAMUSCULAR | Status: AC | PRN

## 2013-09-27 MED ORDER — ACETAMINOPHEN 325 MG PO TABS: 650.0000 mg | ORAL_TABLET | ORAL | Status: DC | PRN

## 2013-09-27 MED ORDER — LACTATED RINGERS IV SOLN
INTRAVENOUS | Status: DC
  Administered 2013-09-28 (×3): via INTRAVENOUS

## 2013-09-28 ENCOUNTER — Encounter (HOSPITAL_COMMUNITY): Payer: 59 | Admitting: Anesthesiology

## 2013-09-28 ENCOUNTER — Encounter (HOSPITAL_COMMUNITY): Payer: Self-pay

## 2013-09-28 ENCOUNTER — Inpatient Hospital Stay (HOSPITAL_COMMUNITY): Payer: 59 | Admitting: Anesthesiology

## 2013-09-28 DIAGNOSIS — O480 Post-term pregnancy: Secondary | ICD-10-CM | POA: Diagnosis not present

## 2013-09-28 DIAGNOSIS — Z3490 Encounter for supervision of normal pregnancy, unspecified, unspecified trimester: Secondary | ICD-10-CM

## 2013-09-28 LAB — RPR

## 2013-09-28 MED ORDER — LACTATED RINGERS IV SOLN
500.0000 mL | Freq: Once | INTRAVENOUS | Status: AC
  Administered 2013-09-28: 500 mL via INTRAVENOUS

## 2013-09-28 MED ORDER — OXYCODONE-ACETAMINOPHEN 5-325 MG PO TABS: 1.0000 | ORAL_TABLET | ORAL | Status: DC | PRN

## 2013-09-28 MED ORDER — TETANUS-DIPHTH-ACELL PERTUSSIS 5-2.5-18.5 LF-MCG/0.5 IM SUSP: 0.5000 mL | Freq: Once | INTRAMUSCULAR | Status: DC

## 2013-09-28 MED ORDER — PHENYLEPHRINE 40 MCG/ML (10ML) SYRINGE FOR IV PUSH (FOR BLOOD PRESSURE SUPPORT): 80.0000 ug | PREFILLED_SYRINGE | INTRAVENOUS | Status: DC | PRN

## 2013-09-28 MED ORDER — DIBUCAINE 1 % RE OINT: 1.0000 "application " | TOPICAL_OINTMENT | RECTAL | Status: DC | PRN

## 2013-09-28 MED ORDER — IBUPROFEN 600 MG PO TABS
600.0000 mg | ORAL_TABLET | Freq: Four times a day (QID) | ORAL | Status: DC
  Administered 2013-09-28 – 2013-09-30 (×8): 600 mg via ORAL
  Filled 2013-09-28 (×9): qty 1

## 2013-09-28 MED ORDER — DIPHENHYDRAMINE HCL 25 MG PO CAPS
25.0000 mg | ORAL_CAPSULE | Freq: Four times a day (QID) | ORAL | Status: DC | PRN
Start: 2013-09-28 — End: 2013-09-30

## 2013-09-28 MED ORDER — WITCH HAZEL-GLYCERIN EX PADS
1.0000 "application " | MEDICATED_PAD | CUTANEOUS | Status: DC | PRN
  Administered 2013-09-28: 1 via TOPICAL

## 2013-09-28 MED ORDER — BUTORPHANOL TARTRATE 1 MG/ML IJ SOLN
2.0000 mg | Freq: Once | INTRAMUSCULAR | Status: AC
  Administered 2013-09-28: 2 mg via INTRAVENOUS
  Filled 2013-09-28: qty 2

## 2013-09-28 MED ORDER — PENICILLIN G POTASSIUM 5000000 UNITS IJ SOLR
5.0000 10*6.[IU] | Freq: Once | INTRAVENOUS | Status: AC
  Administered 2013-09-28: 5 10*6.[IU] via INTRAVENOUS
  Filled 2013-09-28: qty 5

## 2013-09-28 MED ORDER — OXYTOCIN 40 UNITS IN LACTATED RINGERS INFUSION - SIMPLE MED
1.0000 m[IU]/min | INTRAVENOUS | Status: DC
  Administered 2013-09-28: 1 m[IU]/min via INTRAVENOUS
  Filled 2013-09-28: qty 1000

## 2013-09-28 MED ORDER — PRENATAL MULTIVITAMIN CH
1.0000 | ORAL_TABLET | Freq: Every day | ORAL | Status: DC
  Administered 2013-09-29: 1 via ORAL
  Filled 2013-09-28 (×2): qty 1

## 2013-09-28 MED ORDER — ZOLPIDEM TARTRATE 5 MG PO TABS: 5.0000 mg | ORAL_TABLET | Freq: Every evening | ORAL | Status: DC | PRN

## 2013-09-28 MED ORDER — ONDANSETRON HCL 4 MG/2ML IJ SOLN: 4.0000 mg | INTRAMUSCULAR | Status: DC | PRN

## 2013-09-28 MED ORDER — LIDOCAINE HCL (PF) 1 % IJ SOLN
INTRAMUSCULAR | Status: DC | PRN
  Administered 2013-09-28 (×2): 4 mL

## 2013-09-28 MED ORDER — PHENYLEPHRINE 40 MCG/ML (10ML) SYRINGE FOR IV PUSH (FOR BLOOD PRESSURE SUPPORT)
80.0000 ug | PREFILLED_SYRINGE | INTRAVENOUS | Status: DC | PRN
  Filled 2013-09-28: qty 10

## 2013-09-28 MED ORDER — SENNOSIDES-DOCUSATE SODIUM 8.6-50 MG PO TABS
2.0000 | ORAL_TABLET | ORAL | Status: DC
  Administered 2013-09-29 – 2013-09-30 (×2): 2 via ORAL
  Filled 2013-09-28 (×2): qty 2

## 2013-09-28 MED ORDER — SIMETHICONE 80 MG PO CHEW: 80.0000 mg | CHEWABLE_TABLET | ORAL | Status: DC | PRN

## 2013-09-28 MED ORDER — BENZOCAINE-MENTHOL 20-0.5 % EX AERO
1.0000 "application " | INHALATION_SPRAY | CUTANEOUS | Status: DC | PRN
  Administered 2013-09-28: 1 via TOPICAL
  Filled 2013-09-28: qty 56

## 2013-09-28 MED ORDER — FENTANYL 2.5 MCG/ML BUPIVACAINE 1/10 % EPIDURAL INFUSION (WH - ANES)
INTRAMUSCULAR | Status: DC | PRN
  Administered 2013-09-28: 14 mL/h via EPIDURAL

## 2013-09-28 MED ORDER — EPHEDRINE 5 MG/ML INJ
10.0000 mg | INTRAVENOUS | Status: DC | PRN
  Filled 2013-09-28: qty 4

## 2013-09-28 MED ORDER — EPHEDRINE 5 MG/ML INJ: 10.0000 mg | INTRAVENOUS | Status: DC | PRN

## 2013-09-28 MED ORDER — LANOLIN HYDROUS EX OINT: TOPICAL_OINTMENT | CUTANEOUS | Status: DC | PRN

## 2013-09-28 MED ORDER — DIPHENHYDRAMINE HCL 50 MG/ML IJ SOLN: 12.5000 mg | INTRAMUSCULAR | Status: DC | PRN

## 2013-09-28 MED ORDER — ONDANSETRON HCL 4 MG PO TABS: 4.0000 mg | ORAL_TABLET | ORAL | Status: DC | PRN

## 2013-09-28 MED ORDER — PENICILLIN G POTASSIUM 5000000 UNITS IJ SOLR
2.5000 10*6.[IU] | INTRAVENOUS | Status: DC
  Administered 2013-09-28 (×3): 2.5 10*6.[IU] via INTRAVENOUS
  Filled 2013-09-28 (×7): qty 2.5

## 2013-09-28 MED ORDER — FENTANYL 2.5 MCG/ML BUPIVACAINE 1/10 % EPIDURAL INFUSION (WH - ANES)
14.0000 mL/h | INTRAMUSCULAR | Status: DC | PRN
  Administered 2013-09-28: 14 mL/h via EPIDURAL
  Filled 2013-09-28 (×2): qty 125

## 2013-09-28 NOTE — H&P (Signed)
Evelyn Hernandez is a 28 y.o. female presenting for post-dates induction.  Patient was admitted last night and received one dose of cytotec.  She SROM'd at around MN and has been on low-dose pitocin since that time.  She is currently comfortable with epidural.  Antepartum course complicated by right sensorineural hearing loss with resolved with a prednisone taper.  She is GBS positive.  Maternal Medical History:  Fetal activity: Perceived fetal activity is normal.   Last perceived fetal movement was within the past hour.    Prenatal complications: no prenatal complications Prenatal Complications - Diabetes: none.    OB History   Grav Para Term Preterm Abortions TAB SAB Ect Mult Living   1              Past Medical History  Diagnosis Date  . Migraine   . Vaginal Pap smear, abnormal   . Hx of pyelonephritis   . Congenital abnormalities     urethropelvic junction obstruction   Past Surgical History  Procedure Laterality Date  . Kidney surgery     Family History: family history includes Anxiety disorder in her father; Cancer in her maternal grandmother, paternal aunt, paternal grandmother, and paternal uncle; Depression in her father; Heart disease in her maternal grandfather; Hypertension in her father, maternal grandmother, mother, and paternal grandfather. Social History:  reports that she has never smoked. She does not have any smokeless tobacco history on file. She reports that she drinks alcohol. She reports that she does not use illicit drugs.   Prenatal Transfer Tool  Maternal Diabetes: No Genetic Screening: Normal Maternal Ultrasounds/Referrals: Normal Fetal Ultrasounds or other Referrals:  None Maternal Substance Abuse:  No Significant Maternal Medications:  None Significant Maternal Lab Results:  Lab values include: Group B Strep positive Other Comments:  None  ROS  Dilation: 1 Effacement (%): 90 Station: -2 Exam by:: [employer] Blood pressure 130/77, pulse 95,  temperature 98.2 F (36.8 C), temperature source Oral, resp. rate 20, height 5\' 4"  (1.626 m), weight 172 lb (78.019 kg), last menstrual period 11/18/2012, SpO2 100.00%. Maternal Exam:  Uterine Assessment: Contraction strength is moderate.  Contraction frequency is regular.   Abdomen: Patient reports no abdominal tenderness. Fundal height is c/w dates.   Estimated fetal weight is 8#8.   Fetal presentation: vertex  Introitus: Normal vulva. Normal vagina.  Amniotic fluid character: meconium stained.  Pelvis: questionable for delivery.   Cervix: Cervix evaluated by digital exam.     Physical Exam  Constitutional: She is oriented to person, place, and time. She appears well-developed and well-nourished.  GI: Soft. There is no rebound and no guarding.  Neurological: She is alert and oriented to person, place, and time.  Skin: Skin is warm and dry.  Psychiatric: She has a normal mood and affect. Her behavior is normal.    Prenatal labs: ABO, Rh: O/Positive/-- (10/16 0000) Antibody: Negative (10/16 0000) Rubella: Immune (10/16 0000) RPR: NON REAC (06/02 1900)  HBsAg: Negative (10/16 0000)  HIV: Non-reactive (10/16 0000)  GBS: Positive (05/27 0000)   Assessment/Plan: 27yo G1 at [redacted]w[redacted]d for [facility] -Will continue to increase pitocin -PCN for GBS ppx -Follow labor curve   Mitchel Honour 09/28/2013, 8:00 AM

## 2013-09-28 NOTE — Anesthesia Procedure Notes (Signed)
Epidural Patient location during procedure: OB Start time: 09/28/2013 6:53 AM  Staffing Anesthesiologist: Ishan Sanroman A. Performed by: anesthesiologist   Preanesthetic Checklist Completed: patient identified, site marked, surgical consent, pre-op evaluation, timeout performed, IV checked, risks and benefits discussed and monitors and equipment checked  Epidural Patient position: sitting Prep: site prepped and draped and DuraPrep Patient monitoring: continuous pulse ox and blood pressure Approach: midline Location: L3-L4 Injection technique: LOR air  Needle:  Needle type: Tuohy  Needle gauge: 17 G Needle length: 9 cm and 9 Needle insertion depth: 5 cm cm Catheter type: closed end flexible Catheter size: 19 Gauge Catheter at skin depth: 10 cm Test dose: negative and Other  Assessment Events: blood not aspirated, injection not painful, no injection resistance, negative IV test and no paresthesia  Additional Notes Patient identified. Risks and benefits discussed including failed block, incomplete  Pain control, post dural puncture headache, nerve damage, paralysis, blood pressure Changes, nausea, vomiting, reactions to medications-both toxic and allergic and post Partum back pain. All questions were answered. Patient expressed understanding and wished to proceed. Sterile technique was used throughout procedure. Epidural site was Dressed with sterile barrier dressing. No paresthesias, signs of intravascular injection Or signs of intrathecal spread were encountered.  Patient was more comfortable after the epidural was dosed. Please see RN's note for documentation of vital signs and FHR which are stable.

## 2013-09-28 NOTE — Lactation Note (Signed)
This note was copied from the chart of Evelyn [place]. Lactation Consultation Note Initial visit at 4 hours of age.  MBU RN requests assist due to borderline low blood sugars and inverted nipples with difficult latch.  Left nipple inverted, but erects with hand pump stimulation and inverts with compression.  Right nipple is everted.  Mom is able to express drops of colostrum with hand pump on left breast.  Baby attempts latch and will suck several times, but does not pull nipple to erect position.  Repositioned to cross cradle hold on right breast.  Baby latches with few attempts and remains suckling for 15 minutes of rhythmic suckling and few swallows.  Baby slows and begins to make a clicking sound and mom complains of increase pain with latch.  Baby removed and mom has a compression stripe across tip of nipple.  Oral assessment with glove reveals a short frenulum with tongue not adequately extending past lower gum line at this time.  Dr. Mayford Knife arrived to assess baby and reported to her my findings.  [facility] LC resources given and discussed.  Encouraged to feed with early cues on demand.  Early newborn behavior discussed.  Hand expression demonstrated with colostrum visible.  Mom will likely need a nipple shield on left nipple, but allowed pediatrician to assess baby at this time.Mom to call for assist as needed. Report given to [facility] RN.   Patient Name: Evelyn Hernandez [id number] Date: 09/28/2013 Reason for consult: Initial assessment   Maternal Data Has patient been taught Hand Expression?: Yes Does the patient have breastfeeding experience prior to this delivery?: No  Feeding Feeding Type: Breast Fed Length of feed: 15 min  LATCH Score/Interventions Latch: Repeated attempts needed to sustain latch, nipple held in mouth throughout feeding, stimulation needed to elicit sucking reflex. Intervention(s): Breast compression;Breast massage;Assist with latch;Adjust position  Audible Swallowing: A  few with stimulation Intervention(s): Skin to skin;Hand expression  Type of Nipple: Inverted (left nipple inverted) Intervention(s): Hand pump;Shells;Reverse pressure Intervention(s): Shells;Hand pump  Comfort (Breast/Nipple): Soft / non-tender     Hold (Positioning): Assistance needed to correctly position infant at breast and maintain latch. Intervention(s): Skin to skin;Position options;Support Pillows;Breastfeeding basics reviewed  LATCH Score: 5  Lactation Tools Discussed/Used Tools: Shells   Consult Status Consult Status: Follow-up Date: 09/29/13    Evelyn Hernandez 09/28/2013, 8:01 PM

## 2013-09-28 NOTE — Anesthesia Preprocedure Evaluation (Signed)
Anesthesia Evaluation  Patient identified by MRN, date of birth, ID band Patient awake    Reviewed: Allergy & Precautions, H&P , Patient's Chart, lab work & pertinent test results  Airway Mallampati: II TM Distance: >3 FB Neck ROM: Full    Dental no notable dental hx. (+) Teeth Intact   Pulmonary neg pulmonary ROS,  breath sounds clear to auscultation  Pulmonary exam normal       Cardiovascular Rhythm:Regular Rate:Normal     Neuro/Psych  Headaches, negative psych ROS   GI/Hepatic Neg liver ROS, GERD-  Medicated and Controlled,  Endo/Other  negative endocrine ROS  Renal/GU Hx/o Pyelonephritis  negative genitourinary   Musculoskeletal negative musculoskeletal ROS (+)   Abdominal   Peds  Hematology negative hematology ROS (+)   Anesthesia Other Findings   Reproductive/Obstetrics (+) Pregnancy                           Anesthesia Physical Anesthesia Plan  ASA: II  Anesthesia Plan: Epidural   Post-op Pain Management:    Induction:   Airway Management Planned: Natural Airway  Additional Equipment:   Intra-op Plan:   Post-operative Plan:   Informed Consent: I have reviewed the patients History and Physical, chart, labs and discussed the procedure including the risks, benefits and alternatives for the proposed anesthesia with the patient or authorized representative who has indicated his/her understanding and acceptance.     Plan Discussed with: Anesthesiologist  Anesthesia Plan Comments:         Anesthesia Quick Evaluation

## 2013-09-29 LAB — CBC
HCT: 30.8 % — ABNORMAL LOW (ref 36.0–46.0)
HEMOGLOBIN: 9.8 g/dL — AB (ref 12.0–15.0)
MCH: 29.3 pg (ref 26.0–34.0)
MCHC: 31.8 g/dL (ref 30.0–36.0)
MCV: 91.9 fL (ref 78.0–100.0)
Platelets: 214 10*3/uL (ref 150–400)
RBC: 3.35 MIL/uL — ABNORMAL LOW (ref 3.87–5.11)
RDW: 15.6 % — ABNORMAL HIGH (ref 11.5–15.5)
WBC: 23.6 10*3/uL — ABNORMAL HIGH (ref 4.0–10.5)

## 2013-09-29 MED ORDER — FERROUS SULFATE 325 (65 FE) MG PO TABS
325.0000 mg | ORAL_TABLET | Freq: Three times a day (TID) | ORAL | Status: DC
  Administered 2013-09-29 – 2013-09-30 (×3): 325 mg via ORAL
  Filled 2013-09-29 (×3): qty 1

## 2013-09-29 NOTE — Anesthesia Postprocedure Evaluation (Signed)
  Anesthesia Post-op Note  Patient: Evelyn Hernandez  Procedure(s) Performed: * No procedures listed *  Patient Location: Mother/Baby  Anesthesia Type:Epidural  Level of Consciousness: awake, alert , oriented and patient cooperative  Airway and Oxygen Therapy: Patient Spontanous Breathing  Post-op Pain: mild  Post-op Assessment: Patient's Cardiovascular Status Stable, Respiratory Function Stable, No headache, No backache, No residual numbness and No residual motor weakness  Post-op Vital Signs: stable  Last Vitals:  Filed Vitals:   09/29/13 0540  BP: 118/77  Pulse: 75  Temp: 36.4 C  Resp: 16    Complications: No apparent anesthesia complications

## 2013-09-29 NOTE — Lactation Note (Signed)
This note was copied from the chart of Evelyn Hernandez. Lactation Consultation Note  Patient Name: Evelyn Hernandez Date: 09/29/2013   [facility] informed by RN, Waynetta Sandy, that this mom is only pumping at this time due to recommendations of LC this morning and baby being finger-fed with curved tip syringe.  RN states mom does not want to resume breastfeeding due to nipple soreness, so LC suggests trying somfort gelpads tonight while resting nipples and pumping.  RN to provide gelpads and notify LC for further assistance as needed.  Maternal Data    Feeding Feeding Type: Bottle Fed - Formula Nipple Type: Slow - flow  LATCH Score/Interventions                      Lactation Tools Discussed/Used   Comfort gelpads (to be provided by RN)  Consult Status   LC follow-up tonight or tomorrow as needed   Zara Chess 09/29/2013, 4:53 PM

## 2013-09-29 NOTE — Progress Notes (Signed)
Post Partum Day 1 Subjective: no complaints, up ad lib, voiding and tolerating PO  Objective: Blood pressure 118/77, pulse 75, temperature 97.6 F (36.4 C), temperature source Oral, resp. rate 16, height 5\' 4"  (1.626 m), weight 172 lb (78.019 kg), SpO2 100.00%, unknown if currently breastfeeding.  Physical Exam:  General: alert and cooperative Lochia: appropriate Uterine Fundus: firm Incision: perineum intact DVT Evaluation: No evidence of DVT seen on physical exam. Negative Homan's sign. No cords or calf tenderness. No significant calf/ankle edema.   Recent Labs  09/27/13 1900 09/29/13 0636  HGB 11.1* 9.8*  HCT 33.7* 30.8*    Assessment/Plan: Plan for discharge tomorrow and Circumcision prior to discharge   LOS: 2 days   Judith Blonder 09/29/2013, 8:04 AM

## 2013-09-29 NOTE — Lactation Note (Signed)
This note was copied from the chart of Evelyn Amzie Marck. Lactation Consultation Note  Follow up consult:  Mother's left nipple is inverted, right flat but everts with stimulation but cracked, bleeding Baby has disorganized suck, unable at this time to bring tongue over bottom gum, limited movement of tongue, short frenulum. Pediatrician made aware. Demonstrated suck training with parent with teach back. Attempted latching on both breasts with #20 & #24 NS but mother could not tolerate pain from baby biting. Set up DEBP, plan is to continue to attempt breastfeeding but mother with post pump both breasts every 3 hours for 15 min. Provided syringe and will discuss after pumping how to give baby any milk that is pumped.   Reviewed applying ebm for soreness and encouraged mother to prepump before breastfeeding and wear shells.   Patient Name: Evelyn Hernandez [id number] Date: 09/29/2013 Reason for consult: Follow-up assessment   Maternal Data    Feeding    LATCH Score/Interventions                      Lactation Tools Discussed/Used Tools: Shells;Nipple Shields Nipple shield size: 20;24 Shell Type: Inverted Pump Review: Setup, frequency, and cleaning;Milk Storage Initiated by:: Evelyn Byes RN Date initiated:: 09/29/13   Consult Status Consult Status: Follow-up Date: 09/30/13 Follow-up type: In-patient    Dulce Sellar Berkelhammer 09/29/2013, 9:07 AM

## 2013-09-30 MED ORDER — IBUPROFEN 600 MG PO TABS: 600.0000 mg | ORAL_TABLET | Freq: Four times a day (QID) | ORAL | Status: DC

## 2013-09-30 MED ORDER — FERROUS SULFATE 325 (65 FE) MG PO TABS
325.0000 mg | ORAL_TABLET | Freq: Three times a day (TID) | ORAL | Status: DC
Start: 2013-09-30 — End: 2017-06-19

## 2013-09-30 NOTE — Discharge Summary (Signed)
Obstetric Discharge Summary Reason for Admission: induction of labor Prenatal Procedures: ultrasound Intrapartum Procedures: spontaneous vaginal delivery Postpartum Procedures: none Complications-Operative and Postpartum: 2 degree perineal laceration Hemoglobin  Date Value Ref Range Status  09/29/2013 9.8* 12.0 - 15.0 g/dL Final     HCT  Date Value Ref Range Status  09/29/2013 30.8* 36.0 - 46.0 % Final    Physical Exam:  General: alert and cooperative Lochia: appropriate Uterine Fundus: firm Incision: perineum intact DVT Evaluation: No evidence of DVT seen on physical exam. Negative Homan's sign. No cords or calf tenderness. No significant calf/ankle edema.  Discharge Diagnoses: Term Pregnancy-delivered  Discharge Information: Date: 09/30/2013 Activity: pelvic rest Diet: routine Medications: PNV, Ibuprofen and Iron Condition: stable Instructions: refer to practice specific booklet Discharge to: home   Newborn Data: Live born female  Birth Weight: 8 lb 14.5 oz (4040 g) APGAR: 8, 9  Home with mother.  Judith Blonder 09/30/2013, 8:09 AM

## 2014-02-27 ENCOUNTER — Encounter (HOSPITAL_COMMUNITY): Payer: Self-pay

## 2015-07-23 ENCOUNTER — Other Ambulatory Visit: Payer: Self-pay | Admitting: Nurse Practitioner

## 2015-07-23 DIAGNOSIS — N6320 Unspecified lump in the left breast, unspecified quadrant: Secondary | ICD-10-CM

## 2015-07-24 ENCOUNTER — Ambulatory Visit
Admission: RE | Admit: 2015-07-24 | Discharge: 2015-07-24 | Disposition: A | Discharge status: Home / Self Care | Payer: Commercial Managed Care - HMO | Source: Ambulatory Visit | Attending: Nurse Practitioner | Admitting: Nurse Practitioner

## 2015-07-24 DIAGNOSIS — N6320 Unspecified lump in the left breast, unspecified quadrant: Secondary | ICD-10-CM

## 2016-07-11 DIAGNOSIS — N912 Amenorrhea, unspecified: Secondary | ICD-10-CM | POA: Diagnosis not present

## 2016-07-14 DIAGNOSIS — N912 Amenorrhea, unspecified: Secondary | ICD-10-CM | POA: Diagnosis not present

## 2016-07-21 DIAGNOSIS — N912 Amenorrhea, unspecified: Secondary | ICD-10-CM | POA: Diagnosis not present

## 2016-07-23 DIAGNOSIS — N912 Amenorrhea, unspecified: Secondary | ICD-10-CM | POA: Diagnosis not present

## 2016-08-07 DIAGNOSIS — N911 Secondary amenorrhea: Secondary | ICD-10-CM | POA: Diagnosis not present

## 2016-09-12 DIAGNOSIS — R599 Enlarged lymph nodes, unspecified: Secondary | ICD-10-CM | POA: Diagnosis not present

## 2016-09-12 DIAGNOSIS — R0789 Other chest pain: Secondary | ICD-10-CM | POA: Diagnosis not present

## 2016-12-30 DIAGNOSIS — N912 Amenorrhea, unspecified: Secondary | ICD-10-CM | POA: Diagnosis not present

## 2017-01-01 DIAGNOSIS — N912 Amenorrhea, unspecified: Secondary | ICD-10-CM | POA: Diagnosis not present

## 2017-01-05 DIAGNOSIS — N912 Amenorrhea, unspecified: Secondary | ICD-10-CM | POA: Diagnosis not present

## 2017-01-26 DIAGNOSIS — N911 Secondary amenorrhea: Secondary | ICD-10-CM | POA: Diagnosis not present

## 2017-02-03 DIAGNOSIS — Z23 Encounter for immunization: Secondary | ICD-10-CM | POA: Diagnosis not present

## 2017-02-03 LAB — OB RESULTS CONSOLE ABO/RH: RH TYPE: POSITIVE

## 2017-02-03 LAB — OB RESULTS CONSOLE HEPATITIS B SURFACE ANTIGEN: Hepatitis B Surface Ag: NEGATIVE

## 2017-02-03 LAB — OB RESULTS CONSOLE RPR: RPR: NONREACTIVE

## 2017-02-03 LAB — OB RESULTS CONSOLE RUBELLA ANTIBODY, IGM: Rubella: IMMUNE

## 2017-02-03 LAB — OB RESULTS CONSOLE ANTIBODY SCREEN: Antibody Screen: NEGATIVE

## 2017-02-03 LAB — OB RESULTS CONSOLE HIV ANTIBODY (ROUTINE TESTING): HIV: NONREACTIVE

## 2017-02-03 LAB — OB RESULTS CONSOLE GC/CHLAMYDIA
Chlamydia: NEGATIVE
GC PROBE AMP, GENITAL: NEGATIVE

## 2017-06-09 DIAGNOSIS — Z3400 Encounter for supervision of normal first pregnancy, unspecified trimester: Secondary | ICD-10-CM | POA: Diagnosis not present

## 2017-06-09 DIAGNOSIS — Z23 Encounter for immunization: Secondary | ICD-10-CM | POA: Diagnosis not present

## 2017-06-19 ENCOUNTER — Inpatient Hospital Stay (HOSPITAL_COMMUNITY)
Admission: AD | Admit: 2017-06-19 | Discharge: 2017-06-19 | Disposition: A | Discharge status: Home / Self Care | Payer: 59 | Source: Ambulatory Visit | Attending: Obstetrics and Gynecology | Admitting: Obstetrics and Gynecology

## 2017-06-19 ENCOUNTER — Other Ambulatory Visit: Payer: Self-pay

## 2017-06-19 ENCOUNTER — Encounter (HOSPITAL_COMMUNITY): Payer: Self-pay | Admitting: *Deleted

## 2017-06-19 DIAGNOSIS — O26893 Other specified pregnancy related conditions, third trimester: Secondary | ICD-10-CM | POA: Diagnosis not present

## 2017-06-19 DIAGNOSIS — Z3A28 28 weeks gestation of pregnancy: Secondary | ICD-10-CM

## 2017-06-19 DIAGNOSIS — R109 Unspecified abdominal pain: Secondary | ICD-10-CM | POA: Insufficient documentation

## 2017-06-19 LAB — URINALYSIS, ROUTINE W REFLEX MICROSCOPIC
Bilirubin Urine: NEGATIVE
Glucose, UA: NEGATIVE mg/dL
Hgb urine dipstick: NEGATIVE
Ketones, ur: NEGATIVE mg/dL
Nitrite: NEGATIVE
PROTEIN: NEGATIVE mg/dL
SPECIFIC GRAVITY, URINE: 1.012 (ref 1.005–1.030)
pH: 7 (ref 5.0–8.0)

## 2017-06-19 NOTE — MAU Provider Note (Signed)
History     CSN: [id number]  Arrival date and time: 06/19/17 1430   First Provider Initiated Contact with Patient 06/19/17 1555     Chief Complaint  Patient presents with  . Abdominal Pain   HPI Evelyn Hernandez is a 32 y.o. G3P1011 at [redacted]w[redacted]d who presents with lower abdominal cramping. She states at 1200 she noticed a cramp in her lower abdomen that she rated a 2/10 and she states sometimes it gets worse. She denies any leaking or bleeding. She states she noticed less fetal movement earlier today, but is now feeling normal fetal movement. She denies any problems during this pregnancy.   OB History    Gravida Para Term Preterm AB Living   3 1 1   1 1    SAB TAB Ectopic Multiple Live Births   1       1      Past Medical History:  Diagnosis Date  . Congenital abnormalities    urethropelvic junction obstruction  . Hx of pyelonephritis   . Migraine   . Vaginal Pap smear, abnormal     Past Surgical History:  Procedure Laterality Date  . KIDNEY SURGERY      Family History  Problem Relation Age of Onset  . Hypertension Mother   . Anxiety disorder Father   . Depression Father   . Hypertension Father   . Cancer Paternal Aunt   . Cancer Paternal Uncle   . Hypertension Maternal Grandmother   . Cancer Maternal Grandmother   . Heart disease Maternal Grandfather   . Cancer Paternal Grandmother   . Hypertension Paternal Grandfather     Social History   Tobacco Use  . Smoking status: Never Smoker  . Smokeless tobacco: Never Used  Substance Use Topics  . Alcohol use: No    Frequency: Never  . Drug use: No    Allergies: No Known Allergies  Medications Prior to Admission  Medication Sig Dispense Refill Last Dose  . ferrous sulfate 325 (65 FE) MG tablet Take 1 tablet (325 mg total) by mouth 3 (three) times daily with meals. 90 tablet 3   . ibuprofen (ADVIL,MOTRIN) 600 MG tablet Take 1 tablet (600 mg total) by mouth every 6 (six) hours. 30 tablet 1   . Prenatal Vit-Fe  Fumarate-FA (PRENATAL MULTIVITAMIN) TABS tablet Take 1 tablet by mouth daily at 12 noon.   09/27/2013 at Unknown time    Review of Systems  Constitutional: Negative.  Negative for fatigue and fever.  HENT: Negative.   Respiratory: Negative.  Negative for shortness of breath.   Cardiovascular: Negative.  Negative for chest pain.  Gastrointestinal: Negative.  Negative for abdominal pain, constipation, diarrhea, nausea and vomiting.  Genitourinary: Negative.  Negative for dysuria, vaginal bleeding and vaginal discharge.  Neurological: Negative.  Negative for dizziness and headaches.   Physical Exam   Blood pressure 120/75, pulse (!) 101, temperature 99.1 F (37.3 C), temperature source Oral, resp. rate 16, height 5\' 4"  (1.626 m), weight 152 lb 12 oz (69.3 kg), SpO2 100 %, unknown if currently breastfeeding.  Physical Exam  Nursing note and vitals reviewed. Constitutional: She is oriented to person, place, and time. She appears well-developed and well-nourished. No distress.  HENT:  Head: Normocephalic.  Eyes: Pupils are equal, round, and reactive to light.  Cardiovascular: Normal rate, regular rhythm and normal heart sounds.  Respiratory: Effort normal and breath sounds normal. No respiratory distress.  GI: Soft. Bowel sounds are normal. She exhibits no distension. There is no  tenderness.  Neurological: She is alert and oriented to person, place, and time.  Skin: Skin is warm and dry.  Psychiatric: She has a normal mood and affect. Her behavior is normal. Judgment and thought content normal.   Fetal Tracing:  Baseline: 130 Variability: moderate Accels: 15x15 Decels: none   Toco: none  Dilation: Closed Effacement (%): Thick Cervical Position: Posterior Exam by:: Ma Hillock. Neill CNM   MAU Course  Procedures Results for orders placed or performed during the hospital encounter of 06/19/17 (from the past 24 hour(s))  Urinalysis, Routine w reflex microscopic     Status: Abnormal    Collection Time: 06/19/17  2:50 PM  Result Value Ref Range   Color, Urine YELLOW YELLOW   APPearance HAZY (A) CLEAR   Specific Gravity, Urine 1.012 1.005 - 1.030   pH 7.0 5.0 - 8.0   Glucose, UA NEGATIVE NEGATIVE mg/dL   Hgb urine dipstick NEGATIVE NEGATIVE   Bilirubin Urine NEGATIVE NEGATIVE   Ketones, ur NEGATIVE NEGATIVE mg/dL   Protein, ur NEGATIVE NEGATIVE mg/dL   Nitrite NEGATIVE NEGATIVE   Leukocytes, UA SMALL (A) NEGATIVE   RBC / HPF 0-5 0 - 5 RBC/hpf   WBC, UA 0-5 0 - 5 WBC/hpf   Bacteria, UA RARE (A) NONE SEEN   Squamous Epithelial / LPF 0-5 (A) NONE SEEN   Amorphous Crystal PRESENT      MDM UA No contractions noted  Reviewed results with Dr. Vincente PoliGrewal- ok to discharge patient home with reassurance and instructions to rest.   Assessment and Plan   1. Abdominal pain in pregnancy, third trimester   2. [redacted] weeks gestation of pregnancy    -Discharge home in stable condition -Encouraged patient to increase water intake -Preterm labor precautions discussed -Patient advised to follow-up with [facility] as scheduled for prenatal care -Patient may return to MAU as needed or if her condition were to change or worsen  Rolm BookbinderCaroline M Neill CNM 06/19/2017, 4:07 PM

## 2017-06-19 NOTE — MAU Note (Signed)
Began having cramping pain late this morning. Worsened after eating lunch.  Noted decreased fetal movement during this time.  Feeling more fetal movement now.

## 2017-06-19 NOTE — Discharge Instructions (Signed)

## 2017-06-19 NOTE — MAU Note (Signed)
Just started cramping before lunch, have gotten really bad (though says they are mild). Was feeling less movement, moving now.

## 2017-08-05 DIAGNOSIS — Z3480 Encounter for supervision of other normal pregnancy, unspecified trimester: Secondary | ICD-10-CM | POA: Diagnosis not present

## 2017-08-13 DIAGNOSIS — O322XX0 Maternal care for transverse and oblique lie, not applicable or unspecified: Secondary | ICD-10-CM | POA: Diagnosis not present

## 2017-08-13 DIAGNOSIS — Z3A36 36 weeks gestation of pregnancy: Secondary | ICD-10-CM | POA: Diagnosis not present

## 2017-08-26 DIAGNOSIS — Z3A38 38 weeks gestation of pregnancy: Secondary | ICD-10-CM | POA: Diagnosis not present

## 2017-08-26 DIAGNOSIS — O26843 Uterine size-date discrepancy, third trimester: Secondary | ICD-10-CM | POA: Diagnosis not present

## 2017-08-27 ENCOUNTER — Telehealth (HOSPITAL_COMMUNITY): Payer: Self-pay | Admitting: *Deleted

## 2017-08-27 ENCOUNTER — Encounter (HOSPITAL_COMMUNITY): Payer: Self-pay | Admitting: *Deleted

## 2017-08-27 LAB — OB RESULTS CONSOLE GBS: STREP GROUP B AG: NEGATIVE

## 2017-08-27 NOTE — Telephone Encounter (Signed)
Preadmission screen  

## 2017-09-02 DIAGNOSIS — O368130 Decreased fetal movements, third trimester, not applicable or unspecified: Secondary | ICD-10-CM | POA: Diagnosis not present

## 2017-09-02 DIAGNOSIS — Z3A39 39 weeks gestation of pregnancy: Secondary | ICD-10-CM | POA: Diagnosis not present

## 2017-09-09 ENCOUNTER — Inpatient Hospital Stay (HOSPITAL_COMMUNITY): Payer: 59 | Admitting: Anesthesiology

## 2017-09-09 ENCOUNTER — Encounter (HOSPITAL_COMMUNITY): Payer: Self-pay | Admitting: *Deleted

## 2017-09-09 ENCOUNTER — Other Ambulatory Visit: Payer: Self-pay

## 2017-09-09 ENCOUNTER — Inpatient Hospital Stay (HOSPITAL_COMMUNITY)
Admission: AD | Admit: 2017-09-09 | Discharge: 2017-09-11 | DRG: 807 | Disposition: A | Discharge status: Home / Self Care | Payer: 59 | Source: Ambulatory Visit | Attending: Obstetrics and Gynecology | Admitting: Obstetrics and Gynecology

## 2017-09-09 DIAGNOSIS — Z3A40 40 weeks gestation of pregnancy: Secondary | ICD-10-CM

## 2017-09-09 DIAGNOSIS — Z3490 Encounter for supervision of normal pregnancy, unspecified, unspecified trimester: Secondary | ICD-10-CM

## 2017-09-09 DIAGNOSIS — Z3483 Encounter for supervision of other normal pregnancy, third trimester: Secondary | ICD-10-CM | POA: Diagnosis not present

## 2017-09-09 LAB — CBC
HEMATOCRIT: 35.7 % — AB (ref 36.0–46.0)
HEMOGLOBIN: 11.7 g/dL — AB (ref 12.0–15.0)
MCH: 31.2 pg (ref 26.0–34.0)
MCHC: 32.8 g/dL (ref 30.0–36.0)
MCV: 95.2 fL (ref 78.0–100.0)
Platelets: 251 10*3/uL (ref 150–400)
RBC: 3.75 MIL/uL — AB (ref 3.87–5.11)
RDW: 14.7 % (ref 11.5–15.5)
WBC: 12.9 10*3/uL — ABNORMAL HIGH (ref 4.0–10.5)

## 2017-09-09 LAB — TYPE AND SCREEN
ABO/RH(D): O POS
Antibody Screen: NEGATIVE

## 2017-09-09 LAB — POCT FERN TEST: POCT FERN TEST: POSITIVE

## 2017-09-09 MED ORDER — OXYCODONE-ACETAMINOPHEN 5-325 MG PO TABS: 1.0000 | ORAL_TABLET | ORAL | Status: DC | PRN

## 2017-09-09 MED ORDER — TERBUTALINE SULFATE 1 MG/ML IJ SOLN
0.2500 mg | Freq: Once | INTRAMUSCULAR | Status: DC | PRN
  Filled 2017-09-09: qty 1

## 2017-09-09 MED ORDER — OXYTOCIN 40 UNITS IN LACTATED RINGERS INFUSION - SIMPLE MED
1.0000 m[IU]/min | INTRAVENOUS | Status: DC
  Administered 2017-09-09: 4 m[IU]/min via INTRAVENOUS
  Administered 2017-09-09: 2 m[IU]/min via INTRAVENOUS

## 2017-09-09 MED ORDER — OXYTOCIN BOLUS FROM INFUSION
500.0000 mL | Freq: Once | INTRAVENOUS | Status: AC
  Administered 2017-09-09: 500 mL via INTRAVENOUS

## 2017-09-09 MED ORDER — LIDOCAINE HCL (PF) 1 % IJ SOLN
30.0000 mL | INTRAMUSCULAR | Status: DC | PRN
  Filled 2017-09-09: qty 30

## 2017-09-09 MED ORDER — FENTANYL 2.5 MCG/ML BUPIVACAINE 1/10 % EPIDURAL INFUSION (WH - ANES)
14.0000 mL/h | INTRAMUSCULAR | Status: DC | PRN
  Administered 2017-09-09: 14 mL/h via EPIDURAL
  Filled 2017-09-09: qty 100

## 2017-09-09 MED ORDER — SOD CITRATE-CITRIC ACID 500-334 MG/5ML PO SOLN: 30.0000 mL | ORAL | Status: DC | PRN

## 2017-09-09 MED ORDER — FENTANYL CITRATE (PF) 100 MCG/2ML IJ SOLN: 50.0000 ug | INTRAMUSCULAR | Status: DC | PRN

## 2017-09-09 MED ORDER — FLEET ENEMA 7-19 GM/118ML RE ENEM: 1.0000 | ENEMA | RECTAL | Status: DC | PRN

## 2017-09-09 MED ORDER — LACTATED RINGERS IV SOLN
INTRAVENOUS | Status: DC
  Administered 2017-09-09: 12:00:00 via INTRAVENOUS

## 2017-09-09 MED ORDER — OXYTOCIN BOLUS FROM INFUSION: 500.0000 mL | Freq: Once | INTRAVENOUS | Status: DC

## 2017-09-09 MED ORDER — ONDANSETRON HCL 4 MG/2ML IJ SOLN: 4.0000 mg | Freq: Four times a day (QID) | INTRAMUSCULAR | Status: DC | PRN

## 2017-09-09 MED ORDER — EPHEDRINE 5 MG/ML INJ
10.0000 mg | INTRAVENOUS | Status: DC | PRN
  Filled 2017-09-09: qty 2

## 2017-09-09 MED ORDER — MISOPROSTOL 25 MCG QUARTER TABLET: 25.0000 ug | ORAL_TABLET | ORAL | Status: DC | PRN

## 2017-09-09 MED ORDER — LIDOCAINE HCL (PF) 1 % IJ SOLN
INTRAMUSCULAR | Status: DC | PRN
  Administered 2017-09-09 (×2): 5 mL via EPIDURAL

## 2017-09-09 MED ORDER — PHENYLEPHRINE 40 MCG/ML (10ML) SYRINGE FOR IV PUSH (FOR BLOOD PRESSURE SUPPORT)
80.0000 ug | PREFILLED_SYRINGE | INTRAVENOUS | Status: DC | PRN
  Filled 2017-09-09: qty 5

## 2017-09-09 MED ORDER — ZOLPIDEM TARTRATE 5 MG PO TABS: 5.0000 mg | ORAL_TABLET | Freq: Every evening | ORAL | Status: DC | PRN

## 2017-09-09 MED ORDER — LACTATED RINGERS IV SOLN: 500.0000 mL | INTRAVENOUS | Status: DC | PRN

## 2017-09-09 MED ORDER — LACTATED RINGERS IV SOLN
500.0000 mL | Freq: Once | INTRAVENOUS | Status: AC
  Administered 2017-09-09: 500 mL via INTRAVENOUS

## 2017-09-09 MED ORDER — OXYCODONE-ACETAMINOPHEN 5-325 MG PO TABS: 2.0000 | ORAL_TABLET | ORAL | Status: DC | PRN

## 2017-09-09 MED ORDER — LIDOCAINE HCL (PF) 1 % IJ SOLN: 30.0000 mL | INTRAMUSCULAR | Status: DC | PRN

## 2017-09-09 MED ORDER — PHENYLEPHRINE 40 MCG/ML (10ML) SYRINGE FOR IV PUSH (FOR BLOOD PRESSURE SUPPORT)
80.0000 ug | PREFILLED_SYRINGE | INTRAVENOUS | Status: DC | PRN
  Filled 2017-09-09: qty 10
  Filled 2017-09-09: qty 5

## 2017-09-09 MED ORDER — ACETAMINOPHEN 325 MG PO TABS
650.0000 mg | ORAL_TABLET | ORAL | Status: DC | PRN
Start: 2017-09-09 — End: 2017-09-10

## 2017-09-09 MED ORDER — DIPHENHYDRAMINE HCL 50 MG/ML IJ SOLN
12.5000 mg | INTRAMUSCULAR | Status: DC | PRN
Start: 2017-09-09 — End: 2017-09-10

## 2017-09-09 MED ORDER — OXYTOCIN 40 UNITS IN LACTATED RINGERS INFUSION - SIMPLE MED
2.5000 [IU]/h | INTRAVENOUS | Status: DC
  Filled 2017-09-09: qty 1000

## 2017-09-09 MED ORDER — ACETAMINOPHEN 325 MG PO TABS: 650.0000 mg | ORAL_TABLET | ORAL | Status: DC | PRN

## 2017-09-09 MED ORDER — OXYTOCIN 40 UNITS IN LACTATED RINGERS INFUSION - SIMPLE MED: 2.5000 [IU]/h | INTRAVENOUS | Status: DC

## 2017-09-09 NOTE — Anesthesia Procedure Notes (Signed)
Epidural Patient location during procedure: OB  Staffing Anesthesiologist: Eilam Shrewsbury, MD Performed: anesthesiologist   Preanesthetic Checklist Completed: patient identified, site marked, surgical consent, pre-op evaluation, timeout performed, IV checked, risks and benefits discussed and monitors and equipment checked  Epidural Patient position: sitting Prep: DuraPrep Patient monitoring: heart rate, continuous pulse ox and blood pressure Approach: right paramedian Location: L3-L4 Injection technique: LOR saline  Needle:  Needle type: Tuohy  Needle gauge: 17 G Needle length: 9 cm and 9 Needle insertion depth: 5 cm Catheter type: closed end flexible Catheter size: 20 Guage Catheter at skin depth: 9 cm Test dose: negative  Assessment Events: blood not aspirated, injection not painful, no injection resistance, negative IV test and no paresthesia  Additional Notes Patient identified. Risks/Benefits/Options discussed with patient including but not limited to bleeding, infection, nerve damage, paralysis, failed block, incomplete pain control, headache, blood pressure changes, nausea, vomiting, reactions to medication both or allergic, itching and postpartum back pain. Confirmed with bedside nurse the patient's most recent platelet count. Confirmed with patient that they are not currently taking any anticoagulation, have any bleeding history or any family history of bleeding disorders. Patient expressed understanding and wished to proceed. All questions were answered. Sterile technique was used throughout the entire procedure. Please see nursing notes for vital signs. Test dose was given through epidural needle and negative prior to continuing to dose epidural or start infusion. Warning signs of high block given to the patient including shortness of breath, tingling/numbness in hands, complete motor block, or any concerning symptoms with instructions to call for help. Patient was given  instructions on fall risk and not to get out of bed. All questions and concerns addressed with instructions to call with any issues.     

## 2017-09-09 NOTE — Progress Notes (Signed)
Pt delivered precipitously in room attended by RN Upon my arrival, placenta had been clamped and cut Placenta delivered spontaneously & intact Small 1st degree laceration repaired with 3-0 vicryl rapide EBL 75cc Apgars 8,9

## 2017-09-09 NOTE — Progress Notes (Signed)
Pt feeling mild increase in ctx  fHT cat 1 Toco Q5 Cvx 1/th/hi  A/P:  Will augment w/ pitocin Epidural prn

## 2017-09-09 NOTE — Anesthesia Pain Management Evaluation Note (Signed)
  CRNA Pain Management Visit Note  Patient: Evelyn Hernandez, 32 y.o., female  "Hello I am a member of the anesthesia team at [facility]. We have an anesthesia team available at all times to provide care throughout the hospital, including epidural management and anesthesia for C-section. I don't know your plan for the delivery whether it a natural birth, water birth, IV sedation, nitrous supplementation, doula or epidural, but we want to meet your pain goals."   1.Was your pain managed to your expectations on prior hospitalizations?   Yes   2.What is your expectation for pain management during this hospitalization?     Epidural  3.How can we help you reach that goal? epidural  Record the patient's initial score and the patient's pain goal.   Pain: 0  Pain Goal: 5 The [facility] wants you to be able to say your pain was always managed very well.  Tia Gelb 09/09/2017

## 2017-09-09 NOTE — MAU Note (Signed)
Now had 3 gushes, soaking through pants.  Stopped in between, clear fluid.  Had some irreg contractions earlier (q 20)- but those have stopped. No bleeding, baby not moving as much this morning. (now moving)

## 2017-09-09 NOTE — H&P (Signed)
Evelyn Hernandez is a 32 y.o. female presenting for ROM.  Denies ctx & vb.  Pregnancy uncomplicated.   OB History    Gravida  3   Para  1   Term  1   Preterm      AB  1   Living  1     SAB  1   TAB      Ectopic      Multiple      Live Births  1          Past Medical History:  Diagnosis Date  . Congenital abnormalities    urethropelvic junction obstruction  . Hx of pyelonephritis   . Migraine   . Vaginal Pap smear, abnormal    Past Surgical History:  Procedure Laterality Date  . KIDNEY SURGERY     Family History: family history includes Anxiety disorder in her father; Cancer in her maternal grandmother, paternal aunt, paternal grandmother, and paternal uncle; Depression in her father; Diabetes in her father; Heart disease in her maternal grandfather; Hypertension in her father, maternal grandmother, mother, and paternal grandfather. Social History:  reports that she has never smoked. She has never used smokeless tobacco. She reports that she does not drink alcohol or use drugs.     Maternal Diabetes: No Genetic Screening: Normal Maternal Ultrasounds/Referrals: Normal Fetal Ultrasounds or other Referrals:  None Maternal Substance Abuse:  No Significant Maternal Medications:  None Significant Maternal Lab Results:  None Other Comments:  None  ROS History Dilation: Closed Effacement (%): 80 Station: -2 Exam by:: Weston,RN Blood pressure 133/86, pulse (!) 113, temperature (!) 97.5 F (36.4 C), temperature source Oral, resp. rate 16, weight 168 lb 8 oz (76.4 kg), SpO2 100 %, unknown if currently breastfeeding. Exam Physical Exam  Prenatal labs: ABO, Rh: O/Positive/-- (10/09 0000) Antibody: Negative (10/09 0000) Rubella: Immune (10/09 0000) RPR: Nonreactive (10/09 0000)  HBsAg: Negative (10/09 0000)  HIV: Non-reactive (10/09 0000)  GBS: Negative (05/02 0000)   Assessment/Plan: Admit Pitocin augmentation prn   Zelphia Cairo 09/09/2017, 11:53  AM

## 2017-09-09 NOTE — Anesthesia Preprocedure Evaluation (Signed)

## 2017-09-10 ENCOUNTER — Inpatient Hospital Stay (HOSPITAL_COMMUNITY)
Admission: RE | Admit: 2017-09-10 | Discharge: 2017-09-10 | Disposition: A | Discharge status: Home / Self Care | Payer: 59 | Source: Ambulatory Visit | Attending: Obstetrics & Gynecology | Admitting: Obstetrics & Gynecology

## 2017-09-10 ENCOUNTER — Encounter (HOSPITAL_COMMUNITY): Payer: Self-pay

## 2017-09-10 LAB — ABO/RH: ABO/RH(D): O POS

## 2017-09-10 LAB — CBC
HEMATOCRIT: 33.4 % — AB (ref 36.0–46.0)
Hemoglobin: 10.8 g/dL — ABNORMAL LOW (ref 12.0–15.0)
MCH: 30.9 pg (ref 26.0–34.0)
MCHC: 32.3 g/dL (ref 30.0–36.0)
MCV: 95.7 fL (ref 78.0–100.0)
Platelets: 221 10*3/uL (ref 150–400)
RBC: 3.49 MIL/uL — ABNORMAL LOW (ref 3.87–5.11)
RDW: 14.9 % (ref 11.5–15.5)
WBC: 21.7 10*3/uL — ABNORMAL HIGH (ref 4.0–10.5)

## 2017-09-10 LAB — RPR: RPR: NONREACTIVE

## 2017-09-10 MED ORDER — ONDANSETRON HCL 4 MG/2ML IJ SOLN: 4.0000 mg | INTRAMUSCULAR | Status: DC | PRN

## 2017-09-10 MED ORDER — WITCH HAZEL-GLYCERIN EX PADS: 1.0000 "application " | MEDICATED_PAD | CUTANEOUS | Status: DC | PRN

## 2017-09-10 MED ORDER — OXYCODONE-ACETAMINOPHEN 5-325 MG PO TABS: 1.0000 | ORAL_TABLET | ORAL | Status: DC | PRN

## 2017-09-10 MED ORDER — PRENATAL MULTIVITAMIN CH
1.0000 | ORAL_TABLET | Freq: Every day | ORAL | Status: DC
  Administered 2017-09-10 – 2017-09-11 (×2): 1 via ORAL
  Filled 2017-09-10 (×2): qty 1

## 2017-09-10 MED ORDER — COCONUT OIL OIL: 1.0000 "application " | TOPICAL_OIL | Status: DC | PRN

## 2017-09-10 MED ORDER — ACETAMINOPHEN 325 MG PO TABS: 650.0000 mg | ORAL_TABLET | ORAL | Status: DC | PRN

## 2017-09-10 MED ORDER — OXYCODONE-ACETAMINOPHEN 5-325 MG PO TABS: 2.0000 | ORAL_TABLET | ORAL | Status: DC | PRN

## 2017-09-10 MED ORDER — FAMOTIDINE 20 MG PO TABS
20.0000 mg | ORAL_TABLET | Freq: Two times a day (BID) | ORAL | Status: DC
  Administered 2017-09-10 – 2017-09-11 (×4): 20 mg via ORAL
  Filled 2017-09-10 (×4): qty 1

## 2017-09-10 MED ORDER — ONDANSETRON HCL 4 MG PO TABS: 4.0000 mg | ORAL_TABLET | ORAL | Status: DC | PRN

## 2017-09-10 MED ORDER — DIPHENHYDRAMINE HCL 25 MG PO CAPS: 25.0000 mg | ORAL_CAPSULE | Freq: Four times a day (QID) | ORAL | Status: DC | PRN

## 2017-09-10 MED ORDER — SENNOSIDES-DOCUSATE SODIUM 8.6-50 MG PO TABS
2.0000 | ORAL_TABLET | ORAL | Status: DC
  Administered 2017-09-10 (×2): 2 via ORAL
  Filled 2017-09-10 (×2): qty 2

## 2017-09-10 MED ORDER — DIBUCAINE 1 % RE OINT: 1.0000 "application " | TOPICAL_OINTMENT | RECTAL | Status: DC | PRN

## 2017-09-10 MED ORDER — MEDROXYPROGESTERONE ACETATE 150 MG/ML IM SUSP: 150.0000 mg | INTRAMUSCULAR | Status: DC | PRN

## 2017-09-10 MED ORDER — BENZOCAINE-MENTHOL 20-0.5 % EX AERO: 1.0000 "application " | INHALATION_SPRAY | CUTANEOUS | Status: DC | PRN

## 2017-09-10 MED ORDER — IBUPROFEN 600 MG PO TABS
600.0000 mg | ORAL_TABLET | Freq: Four times a day (QID) | ORAL | Status: DC
  Administered 2017-09-10 – 2017-09-11 (×7): 600 mg via ORAL
  Filled 2017-09-10 (×7): qty 1

## 2017-09-10 MED ORDER — TETANUS-DIPHTH-ACELL PERTUSSIS 5-2.5-18.5 LF-MCG/0.5 IM SUSP: 0.5000 mL | Freq: Once | INTRAMUSCULAR | Status: DC

## 2017-09-10 MED ORDER — SIMETHICONE 80 MG PO CHEW: 80.0000 mg | CHEWABLE_TABLET | ORAL | Status: DC | PRN

## 2017-09-10 MED ORDER — MEASLES, MUMPS & RUBELLA VAC ~~LOC~~ INJ
0.5000 mL | INJECTION | Freq: Once | SUBCUTANEOUS | Status: DC
  Filled 2017-09-10: qty 0.5

## 2017-09-10 NOTE — Lactation Note (Signed)
This note was copied from a baby's chart. Lactation Consultation Note  Patient Name: Evelyn Hernandez [id number] Date: 09/10/2017   [facility] Follow Up:  Mother requested help; as I arrived she had baby swaddled and he was content.  He was not showing feeding cues and she has recently fed him.  Mother is questioning whether or not he has tongue tie.  Her first child had tongue tie and it was clipped at about 1 1/2-2 weeks as far as mother can remember.  Instead of putting baby to the breast she pumped and bottle fed since she was in that routine prior to clipping.  Upon assessment, it appears that he may have a tongue tie also.  He keeps his tongue back and up high toward his palate.  He can extend it over the lower alveolar ridge with encouragement on a gloved finger.  He also has a slight notch in his tongue.  His suck on my gloved finger is strong and rhythmic.    Mother stated she is very sore and has a NS but baby does not like it.  She is going to call me back when he is ready to feed again.                  Eli Pattillo R Chailyn Racette 09/10/2017, 9:04 PM

## 2017-09-10 NOTE — Anesthesia Postprocedure Evaluation (Signed)
Anesthesia Post Note  Patient: Antonette Hendricks  Procedure(s) Performed: AN AD HOC LABOR EPIDURAL     Patient location during evaluation: Mother Baby Anesthesia Type: Epidural Level of consciousness: awake, awake and alert and oriented Pain management: pain level controlled Vital Signs Assessment: post-procedure vital signs reviewed and stable Respiratory status: spontaneous breathing Cardiovascular status: blood pressure returned to baseline and stable Postop Assessment: no headache, epidural receding, no backache, patient able to bend at knees, no apparent nausea or vomiting, adequate PO intake and able to ambulate Anesthetic complications: no    Last Vitals:  Vitals:   09/10/17 0149 09/10/17 0614  BP: (!) 119/91 112/86  Pulse: 96 86  Resp: 16 17  Temp: 37.1 C 36.6 C  SpO2:      Last Pain:  Vitals:   09/10/17 0614  TempSrc: Oral  PainSc:    Pain Goal:                 Cleda Clarks

## 2017-09-10 NOTE — Lactation Note (Signed)
This note was copied from a baby's chart. Lactation Consultation Note  Patient Name: Evelyn Hernandez [id number] Date: 09/10/2017    [facility] Follow Up:  Mother called for latch assistance.    Mother's breasts are slightly full with some areolar edema.  She has breast shells at the bedside but stated she has not been wearing them.  Her nipples invert when hand expression is done and I could not express any colostrum drops.  Mother knows how to do breast massage and hand expression.  Her nipples are bright pink, pinched and very tender.  With her permission, we tried the #24 NS that is at her bedside.  Infant has a small mouth and tight suck (see earlier note).  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  Mother stated it felt a little bit better but she still was not able to comfortably feed with the NS.  I had put 2 mls in the NS which he sucked out.  I would suggest a #20 NS next time.  With a curved tip syringe he sucked an additional 23 mls vigorously and without difficulty.  Father assisted with this feeding.  Parents remembered doing this with their first child and appreciated the additional help feeding Rhett.  I initiated the DEBP with mother to stimulate milk production and to help evert her nipples.  I encouraged post pumping throughout the night.  Parents will watch for feeding cues and feed at least every 3 hours.  I explained the pump parts and cleaning to the parents.  Provided comfort gels for mother with instructions for use.    I swaddled baby and he is content in his bassinet.  The parents are wanting to sleep and will call for assistance as needed.  RN updated.       Orra Nolde R Tekoa Amon 09/10/2017, 10:40 PM

## 2017-09-10 NOTE — Lactation Note (Addendum)
This note was copied from a baby's chart. Lactation Consultation Note  Patient Name: Evelyn Hernandez [id number] Date: 09/10/2017  Pecola Leisure is 19 hours old  LC reviewed and updated the doc flow per dad  Mom and dad baby recently breast fed at 5:20 p for 25 mins  And now is sleeping.  LC reviewed basics of breast feeding and what is normal newborn  Feeding behavior. LC reviewed the importance of feeding your baby  8-12 times a day and with feeding cues. If it has been a while since  The baby has fed, to check diaper and place baby STS, baby's have a strong  Sense of smell, and it will wake him up/ along with hearing moms heart beat,  And sense breathing.  LC encouraged mom to page for feeding assessment for LC.   LC reviewed chart and earlier am was using the NS    Maternal Data    Feeding LATCH Score                   Interventions    Lactation Tools Discussed/Used     Consult Status      [name] 09/10/2017, 7:20 PM

## 2017-09-10 NOTE — Lactation Note (Signed)
This note was copied from a baby's chart. Lactation Consultation Note Baby 8 hrs old. Baby cueing, suckling on hands. Wouldn't sustain latch to breast. Rt. Nipple everted, latched easier. Lt. Nipple inverted. Finger stimulation everts nipple for brief time. Inverts quickly if not kept stimulated.  Baby unable to maintain latch to Lt. Nipple.  Shells given, strongly encouraged to wear in bra. Fitted mom w/#20 NS. Demonstrated application. NS kept nipple everted. Suggested football hold. Assisted in latching. Taught "C" hold when latching. Baby tongue thrusting after suckling a few sucks, getting mad d/t can't hold in his mouth w/o NS.  W/NS pops off and on less. Will aggressive suck when first latches then sleeps. Discussed stimulation for feeding. Newborn behavior, feeding habits, STS, I&O, cluster feeding, supply and demand discussed.   Suggested baby not to be circumcised today d/t not feeding well at this time.  Mom unable to BF her first child now 18 yrs old d/t inverted nipples and pain. Mom tried NS but didn't like them. Mom pumped and bottle fed. Encouraged to call for assistance, wake and stimulate baby to feed if not cueing in 3 hrs.   Patient Name: Evelyn Hernandez [id number] Date: 09/10/2017 Reason for consult: Initial assessment   Maternal Data Has patient been taught Hand Expression?: Yes Does the patient have breastfeeding experience prior to this delivery?: Yes  Feeding Feeding Type: Breast Fed Length of feed: 0 min  LATCH Score Latch: Too sleepy or reluctant, no latch achieved, no sucking elicited.  Audible Swallowing: None  Type of Nipple: Inverted  Comfort (Breast/Nipple): Soft / non-tender  Hold (Positioning): Full assist, staff holds infant at breast  LATCH Score: 2  Interventions Interventions: Breast feeding basics reviewed;Adjust position;Assisted with latch;Support pillows;Skin to skin;Position options;Breast massage;Hand express;Pre-pump if  needed;Shells;Breast compression;Hand pump  Lactation Tools Discussed/Used Tools: Shells;Pump;Nipple Shields Nipple shield size: 20;24 Shell Type: Inverted Breast pump type: Manual WIC Program: No Initiated by:: Peri Jefferson RN IBCLC Date initiated:: 09/10/17   Consult Status Consult Status: Follow-up Date: 09/10/17 Follow-up type: In-patient    Winna Golla, Diamond Nickel 09/10/2017, 7:05 AM

## 2017-09-10 NOTE — Progress Notes (Signed)
Post Partum Day 1 Subjective: no complaints, up ad lib, voiding and tolerating PO.  Parents desire circ but lactation requests deferral until tomorrow secondary to poor feeding.  Objective: Blood pressure 112/86, pulse 86, temperature 97.8 F (36.6 C), temperature source Oral, resp. rate 17, height  (1.626 m), weight 168 lb 8 oz (76.4 kg), SpO2 100 %, unknown if currently breastfeeding.  Physical Exam:  General: alert, cooperative and appears stated age Lochia: appropriate Uterine Fundus: firm Incision: healing well, no significant drainage, no dehiscence DVT Evaluation: No evidence of DVT seen on physical exam. Negative Homan's sign. No cords or calf tenderness.  Recent Labs    09/09/17 1129 09/10/17 0530  HGB 11.7* 10.8*  HCT 35.7* 33.4*    Assessment/Plan: Plan for discharge tomorrow and Circumcision prior to discharge   LOS: 1 day   Danielys Madry 09/10/2017, 9:09 AM

## 2017-09-11 MED ORDER — IBUPROFEN 600 MG PO TABS: 600.0000 mg | ORAL_TABLET | Freq: Four times a day (QID) | ORAL | 0 refills | Status: AC

## 2017-09-11 NOTE — Lactation Note (Signed)
This note was copied from a baby's chart. Lactation Consultation Note  Mother request assistance with latching infant. Mother has had difficulty with latching . She has been using a #20 nipple shield on the left nipple . She has inverted nipple on the left and nipple on the right is everted.  Attempt to latch infant on the left . Infant latched on the upper half of mothers nipple. Unable to flange infants lips. Repeat attempts to latch.  #20 nipple shield applied and infant doesn't open mouth wide and has a very difficult time flanging his lips.  Infant was given 10 ml of formula with a #5 fr feeding tube at breast.  Several attempts to latch infant on the alternate breast. Infant has a very shallow latch and pinches the upper half of mothers rt nipple as well. Attempt to get infants lips flange but lips remained prused. Observed that infant has a high palate with a short tight lingual frenula. Labial frenula tight and infant has difficulty flanging top lip as well. Mother reports that her now 30 yr old son had a tight frenula and it was clipped when he was small.  Father gave infant 4 ml of ebm and then finger fed with #5 fr feeding tube another 12ml. Parents have  Supplemental guidelines for feeding ebm and formula until mothers milk comes to volume.  Mother is very committed to establishing a good milk supply. She is pumping every 2-3 hours and is now getting 5-10 ml. Mother was advised to follow up with Dr Evelyn Hernandez to have infants tongue evaluated. Mother was advised in good breast massage and ice to prevent engorgement. Encouraged mother to rest as much as possible. Mother was also advised to follow up to [facility] and schedule a outpatient appt for more assistance with latching infant after tongue revised.  Mother was given a written plan. Advised to follow up with [facility] services as needed.   Patient Name: Evelyn Hernandez [id number] Date: 09/11/2017 Reason for consult: Follow-up assessment   Maternal  Data    Feeding Feeding Type: Formula Length of feed: 30 min(on and off )  LATCH Score                   Interventions    Lactation Tools Discussed/Used Tools: 77F feeding tube / Syringe Shell Type: Inverted   Consult Status      [name] [facility] 09/11/2017, 12:03 PM

## 2017-09-11 NOTE — Discharge Summary (Signed)
Obstetric Discharge Summary Reason for Admission: onset of labor Prenatal Procedures: none Intrapartum Procedures: spontaneous vaginal delivery Postpartum Procedures: none Complications-Operative and Postpartum: none Hemoglobin  Date Value Ref Range Status  09/10/2017 10.8 (L) 12.0 - 15.0 g/dL Final   HCT  Date Value Ref Range Status  09/10/2017 33.4 (L) 36.0 - 46.0 % Final    Physical Exam:  General: alert Lochia: appropriate Uterine Fundus: firm Incision: healing well DVT Evaluation: No evidence of DVT seen on physical exam.  Discharge Diagnoses: Term Pregnancy-delivered  Discharge Information: Date: 09/11/2017 Activity: pelvic rest Diet: routine Medications: PNV and Ibuprofen Condition: stable Instructions: refer to practice specific booklet Discharge to: home Follow-up Information    [place] Schedule an appointment as soon as possible for a visit in 6 week(s).   Contact information: [address] [place] [address] [phone]           Newborn Data: Live born female  Birth Weight: 7 lb 10.2 oz (3464 g) APGAR: 8, 9  Newborn Delivery   Birth date/time:  09/09/2017 22:29:00 Delivery type:  Vaginal, Spontaneous     Home with mother.  Vernell Back Milana Obey 09/11/2017, 8:24 AM

## 2017-10-23 DIAGNOSIS — Z1389 Encounter for screening for other disorder: Secondary | ICD-10-CM | POA: Diagnosis not present

## 2018-01-21 DIAGNOSIS — Z23 Encounter for immunization: Secondary | ICD-10-CM | POA: Diagnosis not present

## 2018-03-10 DIAGNOSIS — J029 Acute pharyngitis, unspecified: Secondary | ICD-10-CM | POA: Diagnosis not present

## 2018-03-10 DIAGNOSIS — J020 Streptococcal pharyngitis: Secondary | ICD-10-CM | POA: Diagnosis not present

## 2018-05-24 DIAGNOSIS — N920 Excessive and frequent menstruation with regular cycle: Secondary | ICD-10-CM | POA: Diagnosis not present

## 2019-01-17 ENCOUNTER — Other Ambulatory Visit: Payer: Self-pay

## 2019-01-17 DIAGNOSIS — Z20822 Contact with and (suspected) exposure to covid-19: Secondary | ICD-10-CM

## 2019-01-18 LAB — NOVEL CORONAVIRUS, NAA: SARS-CoV-2, NAA: DETECTED — AB

## 2019-05-24 ENCOUNTER — Other Ambulatory Visit: Payer: Self-pay | Admitting: Radiology

## 2019-05-24 DIAGNOSIS — N6310 Unspecified lump in the right breast, unspecified quadrant: Secondary | ICD-10-CM

## 2019-06-09 ENCOUNTER — Ambulatory Visit
Admission: RE | Admit: 2019-06-09 | Discharge: 2019-06-09 | Disposition: A | Discharge status: Home / Self Care | Payer: 59 | Source: Ambulatory Visit | Attending: Radiology | Admitting: Radiology

## 2019-06-09 ENCOUNTER — Other Ambulatory Visit: Payer: Self-pay | Admitting: Radiology

## 2019-06-09 ENCOUNTER — Other Ambulatory Visit: Payer: Self-pay

## 2019-06-09 DIAGNOSIS — N6310 Unspecified lump in the right breast, unspecified quadrant: Secondary | ICD-10-CM

## 2019-06-09 DIAGNOSIS — N6320 Unspecified lump in the left breast, unspecified quadrant: Secondary | ICD-10-CM

## 2019-06-14 ENCOUNTER — Other Ambulatory Visit: Payer: 59

## 2019-09-28 DIAGNOSIS — Z30431 Encounter for routine checking of intrauterine contraceptive device: Secondary | ICD-10-CM | POA: Diagnosis not present

## 2019-11-29 DIAGNOSIS — N943 Premenstrual tension syndrome: Secondary | ICD-10-CM | POA: Diagnosis not present

## 2019-11-29 DIAGNOSIS — N920 Excessive and frequent menstruation with regular cycle: Secondary | ICD-10-CM | POA: Diagnosis not present

## 2019-12-02 DIAGNOSIS — R102 Pelvic and perineal pain: Secondary | ICD-10-CM | POA: Diagnosis not present

## 2019-12-26 DIAGNOSIS — Z01419 Encounter for gynecological examination (general) (routine) without abnormal findings: Secondary | ICD-10-CM | POA: Diagnosis not present

## 2019-12-26 DIAGNOSIS — Z6822 Body mass index (BMI) 22.0-22.9, adult: Secondary | ICD-10-CM | POA: Diagnosis not present

## 2020-01-06 DIAGNOSIS — Z20828 Contact with and (suspected) exposure to other viral communicable diseases: Secondary | ICD-10-CM | POA: Diagnosis not present

## 2020-01-13 DIAGNOSIS — J029 Acute pharyngitis, unspecified: Secondary | ICD-10-CM | POA: Diagnosis not present

## 2020-02-03 DIAGNOSIS — Z23 Encounter for immunization: Secondary | ICD-10-CM | POA: Diagnosis not present

## 2020-02-03 DIAGNOSIS — E0590 Thyrotoxicosis, unspecified without thyrotoxic crisis or storm: Secondary | ICD-10-CM | POA: Diagnosis not present

## 2020-02-03 DIAGNOSIS — Z13220 Encounter for screening for lipoid disorders: Secondary | ICD-10-CM | POA: Diagnosis not present

## 2020-02-03 DIAGNOSIS — Z0000 Encounter for general adult medical examination without abnormal findings: Secondary | ICD-10-CM | POA: Diagnosis not present

## 2020-02-03 DIAGNOSIS — E538 Deficiency of other specified B group vitamins: Secondary | ICD-10-CM | POA: Diagnosis not present

## 2020-06-08 DIAGNOSIS — N920 Excessive and frequent menstruation with regular cycle: Secondary | ICD-10-CM | POA: Diagnosis not present

## 2020-06-08 DIAGNOSIS — Z30432 Encounter for removal of intrauterine contraceptive device: Secondary | ICD-10-CM | POA: Diagnosis not present

## 2020-06-08 DIAGNOSIS — Z3040 Encounter for surveillance of contraceptives, unspecified: Secondary | ICD-10-CM | POA: Diagnosis not present

## 2020-12-02 DIAGNOSIS — H6991 Unspecified Eustachian tube disorder, right ear: Secondary | ICD-10-CM | POA: Diagnosis not present

## 2020-12-06 DIAGNOSIS — N760 Acute vaginitis: Secondary | ICD-10-CM | POA: Diagnosis not present

## 2020-12-06 DIAGNOSIS — H6981 Other specified disorders of Eustachian tube, right ear: Secondary | ICD-10-CM | POA: Diagnosis not present

## 2021-01-10 DIAGNOSIS — H9041 Sensorineural hearing loss, unilateral, right ear, with unrestricted hearing on the contralateral side: Secondary | ICD-10-CM | POA: Diagnosis not present

## 2021-01-10 DIAGNOSIS — H9192 Unspecified hearing loss, left ear: Secondary | ICD-10-CM | POA: Diagnosis not present

## 2021-01-10 DIAGNOSIS — H9311 Tinnitus, right ear: Secondary | ICD-10-CM | POA: Diagnosis not present

## 2021-01-10 DIAGNOSIS — H93291 Other abnormal auditory perceptions, right ear: Secondary | ICD-10-CM | POA: Diagnosis not present

## 2021-01-20 IMAGING — US US BREAST*L* LIMITED INC AXILLA
1 series · 2 of 2 positions shown · non-contrast
Comparison: Left breast ultrasound dated 07/24/2015.

CLINICAL DATA: Mass felt by the patient in the medial left breast
for the past month. It started out with tenderness which is
currently intermittent and mild. No family history of breast cancer.

EXAM:
DIGITAL DIAGNOSTIC BILATERAL MAMMOGRAM WITH CAD AND TOMO
ULTRASOUND LEFT BREAST

[Series 1: us breast*left* limited inc axilla · 0.06mm/px · 2 of 2 slices shown]
[im 1/2]
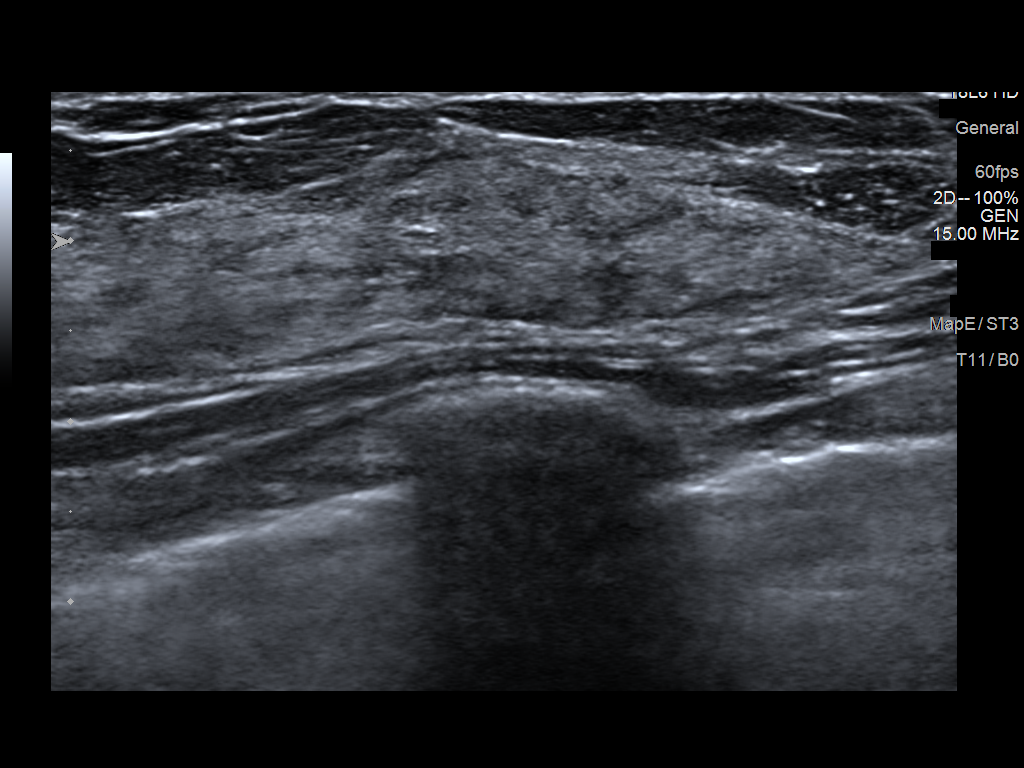
[im 2/2]
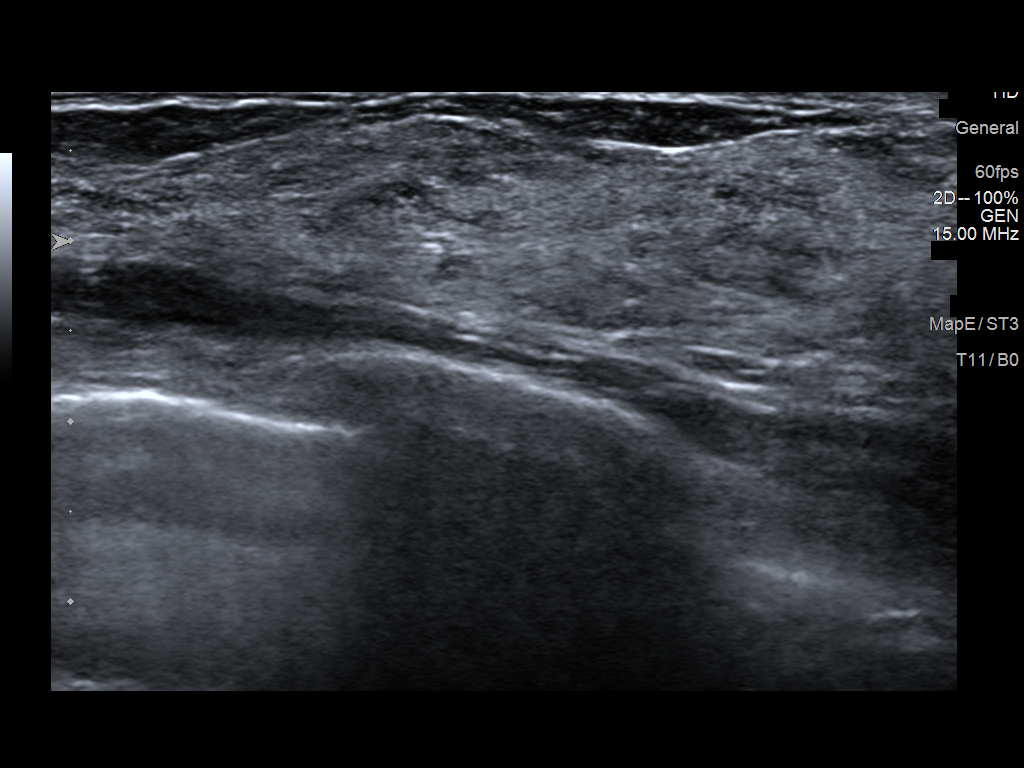

[2 of 2 positions shown; findings below may reference images not displayed]

ACR Breast Density Category d: The breast tissue is extremely dense,
which lowers the sensitivity of mammography.
FINDINGS: Mammographically normal appearing breasts with no mass or other
findings suspicious for malignancy in either breast.

Mammographic images were processed with CAD.

On physical exam, the patient is tender to palpation in the medial
and inferior portions of the left breast with no discrete palpable
mass today. She was unable to pinpoint a discrete mass at
ultrasound.

Targeted ultrasound is performed, showing normal appearing breast
tissue throughout the medial and inferior left breast, including
dense glandular tissue. No mass or other findings suspicious for
malignancy were seen.
IMPRESSION: No evidence of malignancy.

RECOMMENDATION:
Annual screening mammography beginning at age 40.

I have discussed the findings and recommendations with the patient.
If applicable, a reminder letter will be sent to the patient
regarding the next appointment.

BI-RADS CATEGORY  1: Negative.

## 2021-02-06 DIAGNOSIS — H539 Unspecified visual disturbance: Secondary | ICD-10-CM | POA: Diagnosis not present

## 2021-02-06 DIAGNOSIS — H9191 Unspecified hearing loss, right ear: Secondary | ICD-10-CM | POA: Diagnosis not present

## 2021-02-06 DIAGNOSIS — Z0000 Encounter for general adult medical examination without abnormal findings: Secondary | ICD-10-CM | POA: Diagnosis not present

## 2021-02-06 DIAGNOSIS — F411 Generalized anxiety disorder: Secondary | ICD-10-CM | POA: Diagnosis not present

## 2021-02-13 DIAGNOSIS — H539 Unspecified visual disturbance: Secondary | ICD-10-CM | POA: Diagnosis not present

## 2021-02-13 DIAGNOSIS — H04123 Dry eye syndrome of bilateral lacrimal glands: Secondary | ICD-10-CM | POA: Diagnosis not present

## 2021-02-13 DIAGNOSIS — H527 Unspecified disorder of refraction: Secondary | ICD-10-CM | POA: Diagnosis not present

## 2021-02-13 DIAGNOSIS — H5702 Anisocoria: Secondary | ICD-10-CM | POA: Diagnosis not present

## 2021-02-15 ENCOUNTER — Other Ambulatory Visit: Payer: Self-pay | Admitting: *Deleted

## 2021-02-15 DIAGNOSIS — R55 Syncope and collapse: Secondary | ICD-10-CM

## 2021-03-11 DIAGNOSIS — D649 Anemia, unspecified: Secondary | ICD-10-CM | POA: Diagnosis not present

## 2021-03-11 DIAGNOSIS — Z01419 Encounter for gynecological examination (general) (routine) without abnormal findings: Secondary | ICD-10-CM | POA: Diagnosis not present

## 2021-03-11 DIAGNOSIS — Z6823 Body mass index (BMI) 23.0-23.9, adult: Secondary | ICD-10-CM | POA: Diagnosis not present

## 2021-05-16 DIAGNOSIS — R140 Abdominal distension (gaseous): Secondary | ICD-10-CM | POA: Diagnosis not present

## 2021-05-16 DIAGNOSIS — F419 Anxiety disorder, unspecified: Secondary | ICD-10-CM | POA: Diagnosis not present

## 2021-11-05 DIAGNOSIS — R2232 Localized swelling, mass and lump, left upper limb: Secondary | ICD-10-CM | POA: Diagnosis not present

## 2021-11-08 ENCOUNTER — Other Ambulatory Visit: Payer: Self-pay | Admitting: Family Medicine

## 2021-11-08 DIAGNOSIS — R2232 Localized swelling, mass and lump, left upper limb: Secondary | ICD-10-CM

## 2021-11-20 ENCOUNTER — Ambulatory Visit
Admission: RE | Admit: 2021-11-20 | Discharge: 2021-11-20 | Disposition: A | Discharge status: Home / Self Care | Payer: Self-pay | Source: Ambulatory Visit | Attending: Family Medicine | Admitting: Family Medicine

## 2021-11-20 ENCOUNTER — Ambulatory Visit
Admission: RE | Admit: 2021-11-20 | Discharge: 2021-11-20 | Disposition: A | Discharge status: Home / Self Care | Payer: BC Managed Care – PPO | Source: Ambulatory Visit | Attending: Family Medicine | Admitting: Family Medicine

## 2021-11-20 DIAGNOSIS — R2232 Localized swelling, mass and lump, left upper limb: Secondary | ICD-10-CM

## 2021-11-20 DIAGNOSIS — N6332 Unspecified lump in axillary tail of the left breast: Secondary | ICD-10-CM | POA: Diagnosis not present

## 2022-02-13 DIAGNOSIS — M25552 Pain in left hip: Secondary | ICD-10-CM | POA: Diagnosis not present

## 2022-02-13 DIAGNOSIS — E538 Deficiency of other specified B group vitamins: Secondary | ICD-10-CM | POA: Diagnosis not present

## 2022-02-13 DIAGNOSIS — Z0000 Encounter for general adult medical examination without abnormal findings: Secondary | ICD-10-CM | POA: Diagnosis not present

## 2022-02-13 DIAGNOSIS — Z13220 Encounter for screening for lipoid disorders: Secondary | ICD-10-CM | POA: Diagnosis not present

## 2022-02-13 DIAGNOSIS — R946 Abnormal results of thyroid function studies: Secondary | ICD-10-CM | POA: Diagnosis not present

## 2022-02-26 DIAGNOSIS — M25552 Pain in left hip: Secondary | ICD-10-CM | POA: Diagnosis not present

## 2022-03-10 DIAGNOSIS — M5416 Radiculopathy, lumbar region: Secondary | ICD-10-CM | POA: Diagnosis not present

## 2022-03-17 DIAGNOSIS — M25552 Pain in left hip: Secondary | ICD-10-CM | POA: Diagnosis not present

## 2022-03-25 DIAGNOSIS — Z6822 Body mass index (BMI) 22.0-22.9, adult: Secondary | ICD-10-CM | POA: Diagnosis not present

## 2022-03-25 DIAGNOSIS — Z1151 Encounter for screening for human papillomavirus (HPV): Secondary | ICD-10-CM | POA: Diagnosis not present

## 2022-03-25 DIAGNOSIS — Z124 Encounter for screening for malignant neoplasm of cervix: Secondary | ICD-10-CM | POA: Diagnosis not present

## 2022-03-25 DIAGNOSIS — Z01419 Encounter for gynecological examination (general) (routine) without abnormal findings: Secondary | ICD-10-CM | POA: Diagnosis not present

## 2022-03-28 DIAGNOSIS — M5416 Radiculopathy, lumbar region: Secondary | ICD-10-CM | POA: Diagnosis not present

## 2022-04-03 DIAGNOSIS — M5416 Radiculopathy, lumbar region: Secondary | ICD-10-CM | POA: Diagnosis not present

## 2022-04-09 DIAGNOSIS — M5416 Radiculopathy, lumbar region: Secondary | ICD-10-CM | POA: Diagnosis not present

## 2022-04-14 DIAGNOSIS — R1032 Left lower quadrant pain: Secondary | ICD-10-CM | POA: Diagnosis not present

## 2022-04-17 DIAGNOSIS — M25552 Pain in left hip: Secondary | ICD-10-CM | POA: Diagnosis not present

## 2022-05-01 DIAGNOSIS — M25552 Pain in left hip: Secondary | ICD-10-CM | POA: Diagnosis not present

## 2022-05-22 DIAGNOSIS — M25552 Pain in left hip: Secondary | ICD-10-CM | POA: Diagnosis not present

## 2022-06-02 DIAGNOSIS — N83202 Unspecified ovarian cyst, left side: Secondary | ICD-10-CM | POA: Diagnosis not present

## 2023-02-17 DIAGNOSIS — N938 Other specified abnormal uterine and vaginal bleeding: Secondary | ICD-10-CM | POA: Diagnosis not present

## 2023-03-31 DIAGNOSIS — N926 Irregular menstruation, unspecified: Secondary | ICD-10-CM | POA: Diagnosis not present

## 2023-03-31 DIAGNOSIS — Z01419 Encounter for gynecological examination (general) (routine) without abnormal findings: Secondary | ICD-10-CM | POA: Diagnosis not present

## 2023-03-31 DIAGNOSIS — Z124 Encounter for screening for malignant neoplasm of cervix: Secondary | ICD-10-CM | POA: Diagnosis not present

## 2023-03-31 DIAGNOSIS — Z6823 Body mass index (BMI) 23.0-23.9, adult: Secondary | ICD-10-CM | POA: Diagnosis not present

## 2023-03-31 DIAGNOSIS — Z1151 Encounter for screening for human papillomavirus (HPV): Secondary | ICD-10-CM | POA: Diagnosis not present

## 2023-06-05 ENCOUNTER — Telehealth (INDEPENDENT_AMBULATORY_CARE_PROVIDER_SITE_OTHER): Payer: Self-pay | Admitting: Otolaryngology

## 2023-06-05 NOTE — Telephone Encounter (Signed)
 Reminder Call: Date: 06/08/2023 Status: Sch  Time: 2:10 PM [address] [place], [place] [address]  Confirmed time and location w/patient.

## 2023-06-08 ENCOUNTER — Encounter (INDEPENDENT_AMBULATORY_CARE_PROVIDER_SITE_OTHER): Payer: Self-pay

## 2023-06-08 ENCOUNTER — Ambulatory Visit (INDEPENDENT_AMBULATORY_CARE_PROVIDER_SITE_OTHER): Payer: BC Managed Care – PPO | Admitting: Audiology

## 2023-06-08 ENCOUNTER — Ambulatory Visit (INDEPENDENT_AMBULATORY_CARE_PROVIDER_SITE_OTHER): Payer: BC Managed Care – PPO | Admitting: Otolaryngology

## 2023-06-08 VITALS — BP 110/73 | HR 64 | Ht 63.0 in | Wt 135.0 lb

## 2023-06-08 DIAGNOSIS — H9042 Sensorineural hearing loss, unilateral, left ear, with unrestricted hearing on the contralateral side: Secondary | ICD-10-CM

## 2023-06-08 DIAGNOSIS — H90A32 Mixed conductive and sensorineural hearing loss, unilateral, left ear with restricted hearing on the contralateral side: Secondary | ICD-10-CM | POA: Diagnosis not present

## 2023-06-08 DIAGNOSIS — H9312 Tinnitus, left ear: Secondary | ICD-10-CM | POA: Diagnosis not present

## 2023-06-08 DIAGNOSIS — R42 Dizziness and giddiness: Secondary | ICD-10-CM | POA: Diagnosis not present

## 2023-06-08 DIAGNOSIS — H9041 Sensorineural hearing loss, unilateral, right ear, with unrestricted hearing on the contralateral side: Secondary | ICD-10-CM | POA: Diagnosis not present

## 2023-06-08 NOTE — Progress Notes (Signed)
 [address] [place], [place] [address] [phone]  Audiological Evaluation    Name: Evelyn Hernandez     DOB:   16-Jan-1986      MRN:   [id number]                                                                                     Service Date: 06/08/2023     Accompanied by: unaccompanied    Patient comes today after Dr. Darlin Ehrlich, ENT sent a referral for a hearing evaluation due to concerns with hearing loss.   Symptoms Yes Details  Hearing loss  [x]  Around 2015 while she was pregnant Dr. Melissa Spring treated her with steroids to treat a low frequency right sided hearing loss which seemed to have helped, per chart notes. After 2015 the patient had another sudden change in hearing possibly left ear which seemed to resolved after non-steroidal treatment by another physician. Today she is referred to us  because she had another episode of a sudden hearing change in the left ear around Christmas 2024 .   Tinnitus  [x]  Patient reports left sided tinnitus with onset when she had the hearing change and vertigo spell during Christmas. Patient has had tinnitus in the past when she has experienced the sudden hearing change.  Ear pain/ infections/pressure  []    Balance problems  [x]  Reports to feel woozy while she had the tinnitus and left ear hearing changes  Noise exposure history  []    Previous ear surgeries  []    Family history of hearing loss  []    Amplification  []    Other  []      Otoscopy: Right ear: Clear external ear canals and notable landmarks visualized on the tympanic membrane. Left ear:  Clear external ear canals and notable landmarks visualized on the tympanic membrane.  Tympanometry: Right ear: Type A- Normal external ear canal volume with normal middle ear pressure and tympanic membrane compliance. Left ear: Type A- Normal external ear canal volume with normal middle ear pressure and tympanic membrane compliance.    Pure tone Audiometry: Right ear- Normal hearing  from [phone] Hz, then mild presumably sensorineural hearing loss from 6000-8000 Hz.   Left ear-  Moderate mixed hearing loss rising to normal from [phone] Hz,  then mild to moderate presumably sensorineural hearing loss from 6000-8000 Hz.  (Air-bone gap only observed at 250Hz )  Speech Audiometry: Right ear- Speech Reception Threshold (SRT) was obtained at 5 dBHL. Left ear-Speech Reception Threshold (SRT) was obtained at 0 dBHL.   Word Recognition Score Tested using NU-6 (MLV) Right ear: 100% was obtained at a presentation level of 60 dBHL with contralateral masking which is deemed as  excellent. Left ear: 96% was obtained at a presentation level of 70 dBHL with contralateral masking which is deemed as  excellent.   The hearing test results were completed under headphones and re-checked with inserts and results are deemed to be of good reliability. 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   Recommendations: Follow up with ENT as scheduled for today. Return  for a hearing evaluation if concerns with hearing changes arise or per MD recommendation.   Vana Arif MARIE LEROUX-MARTINEZ, AUD

## 2023-06-09 DIAGNOSIS — R42 Dizziness and giddiness: Secondary | ICD-10-CM | POA: Insufficient documentation

## 2023-06-09 DIAGNOSIS — H9042 Sensorineural hearing loss, unilateral, left ear, with unrestricted hearing on the contralateral side: Secondary | ICD-10-CM | POA: Insufficient documentation

## 2023-06-09 DIAGNOSIS — H9312 Tinnitus, left ear: Secondary | ICD-10-CM | POA: Insufficient documentation

## 2023-06-09 NOTE — Progress Notes (Signed)
Patient ID: Evelyn Hernandez, female   DOB: 12/27/1985, 38 y.o.   MRN: [id number]  CC: Recurrent dizziness  HPI:  Evelyn Hernandez is a 38 y.o. female who presents today complaining of recurrent dizziness since 2015.  Her initial dizziness episode in 2015 was described as a lightheaded and off-balance sensation.  She was noted to have left middle ear effusion, and was treated with systemic steroid, with improvement in her dizziness.  She had another episode in 2022.  At that time, she has also noted left ear tinnitus and left ear hearing loss.  Dizziness lasted for several months.  In December 2024, she started experiencing increasing left ear hearing loss, left ear tinnitus, and more dizziness.  She was again treated with systemic steroid, with improvement in her dizziness.  However, her left ear hearing loss and tinnitus have persisted.  Currently she denies any otalgia, otorrhea, or spinning vertigo.  Her previous MRI scan in 2006 was negative for intracranial abnormality.  Past Medical History:  Diagnosis Date   Congenital abnormalities    urethropelvic junction obstruction   Hx of pyelonephritis    Migraine    Vaginal Pap smear, abnormal     Past Surgical History:  Procedure Laterality Date   KIDNEY SURGERY      Family History  Problem Relation Age of Onset   Hypertension Mother    Anxiety disorder Father    Depression Father    Hypertension Father    Diabetes Father    Cancer Paternal Aunt    Cancer Paternal Uncle    Hypertension Maternal Grandmother    Cancer Maternal Grandmother    Heart disease Maternal Grandfather    Cancer Paternal Grandmother    Hypertension Paternal Grandfather     Social History:  reports that she has never smoked. She has never used smokeless tobacco. She reports that she does not drink alcohol and does not use drugs.  Allergies: No Known Allergies  Prior to Admission medications   Medication Sig Start Date End Date Taking? Authorizing Provider   Prenatal Vit-Fe Fumarate-FA (PRENATAL MULTIVITAMIN) TABS tablet Take 1 tablet by mouth daily at 12 noon.   Yes [provider]  ibuprofen (ADVIL,MOTRIN) 600 MG tablet Take 1 tablet (600 mg total) by mouth every 6 (six) hours. Patient not taking: Reported on 06/08/2023 09/11/17   Richarda Overlie, MD  Olopatadine HCl 0.7 % SOLN Apply 1 drop to eye daily as needed (irritation). Patient not taking: Reported on 06/08/2023    [provider]  ranitidine (ZANTAC) 150 MG tablet take one tablet daily Patient not taking: Reported on 06/08/2023 05/16/17   [provider]    Blood pressure 110/73, pulse 64, height 5\' 3"  (1.6 m), weight 135 lb (61.2 kg), SpO2 98%. Exam: General: Communicates without difficulty, well nourished, no acute distress. Head: Normocephalic, no evidence injury, no tenderness, facial buttresses intact without stepoff. Face/sinus: No tenderness to palpation and percussion. Facial movement is normal and symmetric. Eyes: PERRL, EOMI. No scleral icterus, conjunctivae clear. Neuro: CN II exam reveals vision grossly intact.  No nystagmus at any point of gaze. Ears: Auricles well formed without lesions.  Ear canals are intact without mass or lesion.  No erythema or edema is appreciated.  The TMs are intact without fluid. Nose: External evaluation reveals normal support and skin without lesions.  Dorsum is intact.  Anterior rhinoscopy reveals congested mucosa over anterior aspect of inferior turbinates and intact septum.  No purulence noted. Oral:  Oral cavity and oropharynx are  intact, symmetric, without erythema or edema.  Mucosa is moist without lesions. Neck: Full range of motion without pain.  There is no significant lymphadenopathy.  No masses palpable.  Thyroid bed within normal limits to palpation.  Parotid glands and submandibular glands equal bilaterally without mass.  Trachea is midline. Neuro:  CN 2-12 grossly intact. Vestibular: No nystagmus at any point of gaze.  Dix Hallpike negative.Vestibular: There is no nystagmus with pneumatic pressure on either tympanic membrane or Valsalva. The cerebellar examination is unremarkable.    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  Assessment: 1.  The patient's symptom complex of recurrent dizziness, left ear tinnitus, and left ear low-frequency sensorineural hearing loss are suggestive of left ear Mnire's disease. 2.  Other possible differential diagnoses include transient BPPV, vestibular migraine, peripheral vestibular dysfunction, or other central/systemic causes.   3.  Her ear canals, tympanic membranes, and middle ear spaces are normal.  Her Dix-Hallpike maneuver is negative.  Plan: 1.  The physical exam findings and the hearing test results are reviewed with the patient. 2.  The pathophysiology of dizziness and Mnire's disease are extensively discussed with the patient.  Other possible differential diagnoses are also reviewed.  Questions are invited and answered. 3.  The treatment options for Mnire's disease are extensively discussed.  The patient is instructed to start a 1500 mg low-salt diet.  Other treatment options, including the use of diuretic, steroid, endolymphatic sac decompression surgery, and other invasive procedures are discussed. 4.  The patient will return for reevaluation in 3 months, sooner if needed.  Karter Haire W Marlies Ligman 06/09/2023, 10:36 AM

## 2023-09-14 ENCOUNTER — Ambulatory Visit (INDEPENDENT_AMBULATORY_CARE_PROVIDER_SITE_OTHER): Payer: BC Managed Care – PPO | Admitting: Otolaryngology

## 2023-11-10 ENCOUNTER — Ambulatory Visit (INDEPENDENT_AMBULATORY_CARE_PROVIDER_SITE_OTHER): Admitting: Otolaryngology

## 2023-11-10 ENCOUNTER — Encounter (INDEPENDENT_AMBULATORY_CARE_PROVIDER_SITE_OTHER): Payer: Self-pay | Admitting: Otolaryngology

## 2023-11-10 VITALS — BP 111/68 | HR 60

## 2023-11-10 DIAGNOSIS — H9312 Tinnitus, left ear: Secondary | ICD-10-CM | POA: Diagnosis not present

## 2023-11-10 DIAGNOSIS — R42 Dizziness and giddiness: Secondary | ICD-10-CM

## 2023-11-10 DIAGNOSIS — H9192 Unspecified hearing loss, left ear: Secondary | ICD-10-CM | POA: Diagnosis not present

## 2023-11-10 DIAGNOSIS — H8102 Meniere's disease, left ear: Secondary | ICD-10-CM

## 2023-11-10 DIAGNOSIS — H9042 Sensorineural hearing loss, unilateral, left ear, with unrestricted hearing on the contralateral side: Secondary | ICD-10-CM

## 2023-11-11 NOTE — Progress Notes (Signed)
 Patient ID: Evelyn Hernandez, female   DOB: [birth year], 38 y.o.   MRN: [id number]  Follow-up: Recurrent dizziness, asymmetric left ear hearing loss  HPI: The patient is a 38 year old female who returns today for follow-up evaluation.  She was last seen in February 2025.  At that time, she was complaining of recurrent dizziness since 2015.  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  Her symptoms were consistent with left ear Mnire's disease.  The patient was treated with a 1500 mg low-salt diet.  The patient returns today reporting 1 episode of mild dizziness last weekend.  She describes the dizziness as an off-balance sensation.  She denies any spinning vertigo.  She denies any recent change in her hearing or tinnitus.  Currently she denies any otalgia, otorrhea, or vertigo.  Exam: General: Communicates without difficulty, well nourished, no acute distress. Head: Normocephalic, no evidence injury, no tenderness, facial buttresses intact without stepoff. Face/sinus: No tenderness to palpation and percussion. Facial movement is normal and symmetric. Eyes: PERRL, EOMI. No scleral icterus, conjunctivae clear. Neuro: CN II exam reveals vision grossly intact.  No nystagmus at any point of gaze. Ears: Auricles well formed without lesions.  Ear canals are intact without mass or lesion.  No erythema or edema is appreciated.  The TMs are intact without fluid. Nose: External evaluation reveals normal support and skin without lesions.  Dorsum is intact.  Anterior rhinoscopy reveals congested mucosa over anterior aspect of inferior turbinates and intact septum.  No purulence noted. Oral:  Oral cavity and oropharynx are intact, symmetric, without erythema or edema.  Mucosa is moist without lesions. Neck: Full range of motion without pain.  There is no significant lymphadenopathy.  No masses palpable.  Thyroid  bed within normal limits to palpation.  Parotid  glands and submandibular glands equal bilaterally without mass.  Trachea is midline. Neuro:  CN 2-12 grossly intact. Vestibular: No nystagmus at any point of gaze. Dix Hallpike negative.Vestibular: There is no nystagmus with pneumatic pressure on either tympanic membrane or Valsalva. The cerebellar examination is unremarkable.     Assessment: 1.  The patient's recurrent dizziness and left ear Mnire's disease are currently under control with a low-salt diet. 2.  Subjectively stable left ear low-frequency hearing loss and tinnitus. 3.  Her ear canals, tympanic membranes, and middle ear spaces are all normal.  Plan: 1.  The physical exam findings are reviewed with the patient. 2.  Continue with the 1500 mg low-salt diet. 3.  The pathophysiology of Mnire's disease is reviewed with the patient. 4.  Other treatment options for Mnire's disease are also discussed. 5.  The patient will return for reevaluation in 6 months, sooner if needed.

## 2024-04-07 DIAGNOSIS — Z6824 Body mass index (BMI) 24.0-24.9, adult: Secondary | ICD-10-CM | POA: Diagnosis not present

## 2024-04-07 DIAGNOSIS — Z01419 Encounter for gynecological examination (general) (routine) without abnormal findings: Secondary | ICD-10-CM | POA: Diagnosis not present

## 2024-05-13 ENCOUNTER — Ambulatory Visit (INDEPENDENT_AMBULATORY_CARE_PROVIDER_SITE_OTHER): Admitting: Otolaryngology
